# Patient Record
Sex: Male | Born: 1985 | Race: White | Hispanic: No | Smoking: Never smoker
Health system: Southern US, Community
[De-identification: ages and names within clinical notes are randomized; demographics above are authoritative.]

## PROBLEM LIST (undated history)

## (undated) DIAGNOSIS — F419 Anxiety disorder, unspecified: Secondary | ICD-10-CM

## (undated) DIAGNOSIS — M1990 Unspecified osteoarthritis, unspecified site: Secondary | ICD-10-CM

## (undated) HISTORY — PX: LUMBAR DISC SURGERY: SHX700

## (undated) HISTORY — PX: COSMETIC SURGERY: SHX468

---

## 2020-10-26 ENCOUNTER — Emergency Department (HOSPITAL_COMMUNITY): Payer: Worker's Compensation

## 2020-10-26 ENCOUNTER — Inpatient Hospital Stay (HOSPITAL_COMMUNITY)
Admission: EM | Admit: 2020-10-26 | Discharge: 2020-10-30 | DRG: 505 | Disposition: A | Discharge status: Home / Self Care | Payer: Worker's Compensation | Attending: Orthopedic Surgery | Admitting: Orthopedic Surgery

## 2020-10-26 DIAGNOSIS — W230XXA Caught, crushed, jammed, or pinched between moving objects, initial encounter: Secondary | ICD-10-CM | POA: Diagnosis present

## 2020-10-26 DIAGNOSIS — Z20822 Contact with and (suspected) exposure to covid-19: Secondary | ICD-10-CM | POA: Diagnosis present

## 2020-10-26 DIAGNOSIS — S93325A Dislocation of tarsometatarsal joint of left foot, initial encounter: Secondary | ICD-10-CM | POA: Diagnosis not present

## 2020-10-26 DIAGNOSIS — Y990 Civilian activity done for income or pay: Secondary | ICD-10-CM

## 2020-10-26 DIAGNOSIS — S93324D Dislocation of tarsometatarsal joint of right foot, subsequent encounter: Secondary | ICD-10-CM

## 2020-10-26 DIAGNOSIS — Z720 Tobacco use: Secondary | ICD-10-CM

## 2020-10-26 DIAGNOSIS — S9782XA Crushing injury of left foot, initial encounter: Secondary | ICD-10-CM

## 2020-10-26 DIAGNOSIS — S93326A Dislocation of tarsometatarsal joint of unspecified foot, initial encounter: Secondary | ICD-10-CM | POA: Diagnosis present

## 2020-10-26 LAB — CBC WITH DIFFERENTIAL/PLATELET
Abs Immature Granulocytes: 0.06 10*3/uL (ref 0.00–0.07)
Basophils Absolute: 0 10*3/uL (ref 0.0–0.1)
Basophils Relative: 0 %
Eosinophils Absolute: 0 10*3/uL (ref 0.0–0.5)
Eosinophils Relative: 0 %
HCT: 42.2 % (ref 39.0–52.0)
Hemoglobin: 14.5 g/dL (ref 13.0–17.0)
Immature Granulocytes: 0 %
Lymphocytes Relative: 7 %
Lymphs Abs: 1 10*3/uL (ref 0.7–4.0)
MCH: 31.5 pg (ref 26.0–34.0)
MCHC: 34.4 g/dL (ref 30.0–36.0)
MCV: 91.7 fL (ref 80.0–100.0)
Monocytes Absolute: 0.9 10*3/uL (ref 0.1–1.0)
Monocytes Relative: 7 %
Neutro Abs: 11.4 10*3/uL — ABNORMAL HIGH (ref 1.7–7.7)
Neutrophils Relative %: 86 %
Platelets: 281 10*3/uL (ref 150–400)
RBC: 4.6 MIL/uL (ref 4.22–5.81)
RDW: 13.2 % (ref 11.5–15.5)
WBC: 13.4 10*3/uL — ABNORMAL HIGH (ref 4.0–10.5)
nRBC: 0 % (ref 0.0–0.2)

## 2020-10-26 LAB — BASIC METABOLIC PANEL
Anion gap: 7 (ref 5–15)
BUN: 10 mg/dL (ref 6–20)
CO2: 26 mmol/L (ref 22–32)
Calcium: 8.5 mg/dL — ABNORMAL LOW (ref 8.9–10.3)
Chloride: 105 mmol/L (ref 98–111)
Creatinine, Ser: 1 mg/dL (ref 0.61–1.24)
GFR, Estimated: >60 mL/min (ref >60)
Glucose, Bld: 127 mg/dL — ABNORMAL HIGH (ref 70–99)
Potassium: 3.4 mmol/L — ABNORMAL LOW (ref 3.5–5.1)
Sodium: 138 mmol/L (ref 135–145)

## 2020-10-26 LAB — RESP PANEL BY RT-PCR (FLU A&B, COVID) ARPGX2
Influenza A by PCR: NEGATIVE
Influenza B by PCR: NEGATIVE
SARS Coronavirus 2 by RT PCR: NEGATIVE

## 2020-10-26 MED ORDER — HYDROMORPHONE HCL 1 MG/ML IJ SOLN
1.0000 mg | Freq: Once | INTRAMUSCULAR | Status: AC
  Administered 2020-10-26: 1 mg via INTRAVENOUS
  Filled 2020-10-26: qty 1

## 2020-10-26 MED ORDER — HYDROMORPHONE HCL 1 MG/ML IJ SOLN
1.0000 mg | Freq: Once | INTRAMUSCULAR | Status: AC
Start: 2020-10-26 — End: 2020-10-26
  Administered 2020-10-26: 1 mg via INTRAVENOUS
  Filled 2020-10-26: qty 1

## 2020-10-26 NOTE — ED Triage Notes (Signed)
BIB [employer] [facility] from work. Pt was on a rolling machinery and left foot got stuck between bar and rollers. When EMS arrived pt foot was unstuck and he was lying on the ground. No LOC. Can move big toe, but not others. Pulses intact.   fentanyl 110 heart rate 132/82 97% room air  18 r AC  18 L FA

## 2020-10-26 NOTE — ED Provider Notes (Signed)
[facility] EMERGENCY DEPARTMENT Provider Note   CSN: [id number] Arrival date & time: 10/26/20  2044     History Chief Complaint  Patient presents with   Foot Injury    Kent Day is a 35 y.o. male with no reported medical history.  Presents to the emergency department with a chief complaint of left foot injury.  Patient states that at approximately 2000 this evening while at work his foot got caught between a rolling conveyor belt.  Foot was stuck for approximately 45 -60 seconds.  Patient endorses pain to left foot.  States that "it feels like my foot is on fire."  Rates pain 9/10 on the pain scale.  Pain is worse with movement or touch.  Patient has had minimal relief with 200 mcg fentanyl he received in route to emergency department.  Patient denies any numbness.  Endorses weakness stating he cannot move 2nd-5th digit of left foot.  Unsure when his last tetanus shot was.   Foot Injury Associated symptoms: no back pain and no neck pain       No past medical history on file.  There are no problems to display for this patient.        No family history on file.     Home Medications Prior to Admission medications   Not on File    Allergies    Patient has no allergy information on record.  Review of Systems   Review of Systems  HENT:  Negative for facial swelling.   Respiratory:  Negative for shortness of breath.   Cardiovascular:  Negative for chest pain.  Gastrointestinal:  Negative for anal bleeding, nausea and vomiting.  Musculoskeletal:  Positive for arthralgias and myalgias. Negative for back pain and neck pain.  Skin:  Positive for wound. Negative for color change, pallor and rash.  Allergic/Immunologic: Negative for immunocompromised state.  Neurological:  Positive for weakness. Negative for numbness.  Hematological:  Does not bruise/bleed easily.  Psychiatric/Behavioral:  Negative for confusion.    Physical Exam Updated Vital  Signs There were no vitals taken for this visit.  Physical Exam Vitals and nursing note reviewed.  Constitutional:      General: He is not in acute distress.    Appearance: He is not ill-appearing, toxic-appearing or diaphoretic.     Comments: In obvious discomfort secondary to complaints of pain  HENT:     Head: Normocephalic.  Eyes:     General: No scleral icterus.       Right eye: No discharge.        Left eye: No discharge.  Cardiovascular:     Rate and Rhythm: Normal rate.  Pulmonary:     Effort: Pulmonary effort is normal.  Musculoskeletal:     Left lower leg: Normal.     Left ankle: No swelling, deformity, ecchymosis or lacerations. No tenderness. Decreased range of motion (2ndary to complaints of left foot pain). Normal pulse.     Left foot: Decreased range of motion. Normal capillary refill. Swelling, deformity, tenderness and bony tenderness present. No crepitus. Normal pulse.     Comments: Obvious deformity with skin tenting to medial aspect of left midfoot.  Patient has full range of motion of left great toe.  Patient unable to move 2nd-5th digits.  Cap refill less than 2 seconds in great toe.  Cap refill 4 seconds in 2nd-5th digits.  +2 DP pulse diffuse tenderness throughout midfoot.  Superficial abrasions noted.  Patient endorses decreased sensation to all  digits of left foot.    Skin:    General: Skin is warm and dry.  Neurological:     General: No focal deficit present.     Mental Status: He is alert.  Psychiatric:        Behavior: Behavior is cooperative.      ED Results / Procedures / Treatments   Labs (all labs ordered are listed, but only abnormal results are displayed) Labs Reviewed  BASIC METABOLIC PANEL - Abnormal; Notable for the following components:      Result Value   Potassium 3.4 (*)    Glucose, Bld 127 (*)    Calcium 8.5 (*)    All other components within normal limits  CBC WITH DIFFERENTIAL/PLATELET - Abnormal; Notable for the following  components:   WBC 13.4 (*)    Neutro Abs 11.4 (*)    All other components within normal limits  RESP PANEL BY RT-PCR (FLU A&B, COVID) [id number]    EKG None  Radiology DG Ankle Complete Left  Result Date: 10/26/2020 CLINICAL DATA:  Left foot injury at work. EXAM: LEFT ANKLE COMPLETE - 3+ VIEW COMPARISON:  None. FINDINGS: There is no evidence of fracture, dislocation, or joint effusion involving the left ankle. There is no evidence of arthropathy or other focal bone abnormality. Soft tissues are unremarkable. IMPRESSION: No abnormality seen involving the left ankle. Fractures and dislocations are seen involving the tarsometatarsal joints and fourth metatarsal as described on foot radiographs of same day. Electronically Signed   By: Lupita Raider M.D.   On: 10/26/2020 21:41   DG Foot Complete Left  Result Date: 10/26/2020 CLINICAL DATA:  Left foot pain after injury at work. EXAM: LEFT FOOT - COMPLETE 3+ VIEW COMPARISON:  None. FINDINGS: Mildly displaced fracture is seen involving the distal fourth metatarsal. Lisfranc dislocations are seen involving the tarsometatarsal joints. IMPRESSION: Lisfranc dislocations are seen involving all 5 tarsometatarsal joints. Mildly displaced distal fourth metatarsal fracture is noted. Electronically Signed   By: Lupita Raider M.D.   On: 10/26/2020 21:40    Procedures Procedures   Medications Ordered in ED Medications  HYDROmorphone (DILAUDID) injection 1 mg (1 mg Intravenous Given 10/26/20 2132)  HYDROmorphone (DILAUDID) injection 1 mg (1 mg Intravenous Given 10/26/20 2306)    ED Course  I have reviewed the triage vital signs and the nursing notes.  Pertinent labs & imaging results that were available during my care of the patient were reviewed by me and considered in my medical decision making (see chart for details).    MDM Rules/Calculators/A&P                           Alert 35 year old male no acute distress, nontoxic appearing.  Presents to  the emergency department with left foot injury.  Left foot was caught between a piece of rolling machinery while at work.  Injury occurred approximately 2000.    +2 DP pulse.  Patient has full range of motion of left great toe.  Patient unable to move 2nd-5th digits.  Cap refill less than 2 seconds in great toe.  Cap refill 4 seconds in 2nd-5th digits.    Will obtain x-ray imaging of left foot and ankle.  Will order basic lab work and COVID-19 swab.  Patient given Dilaudid for pain management.  X-ray imaging shows Lisfranc dislocations involving all 5 tarsometatarsal joints.  Mildly displaced distal fourth metatarsal fracture.  Will consult on-call orthopedic provider. 2200 spoke  to Dr. Roda Shutters who advised he will see the patient for admission.  Patient will need to be n.p.o. after midnight.  On serial reexamination patient continues to have +2 DP pulse.  Final Clinical Impression(s) / ED Diagnoses Final diagnoses:  Lisfranc dislocation, left, initial encounter    Rx / DC Orders ED Discharge Orders     None        Haskel Schroeder, PA-C 10/26/20 2355    Rozelle Logan, DO 10/27/20 2348

## 2020-10-26 NOTE — Progress Notes (Signed)
Orthopedic Tech Progress Note Patient Details:  Kent Day 08-09-1985 [id number] Added an ABD PAD to foot before applying the [employer] DRESSING to patient   Ortho Devices Type of Ortho Device: Lenora Boys splint Ortho Device/Splint Location: LLE Ortho Device/Splint Interventions: Ordered, Application, Adjustment   Post Interventions Patient Tolerated: Well Instructions Provided: Care of device  [name] 10/26/2020, 10:33 PM

## 2020-10-27 ENCOUNTER — Inpatient Hospital Stay (HOSPITAL_COMMUNITY): Payer: Worker's Compensation | Admitting: Anesthesiology

## 2020-10-27 ENCOUNTER — Inpatient Hospital Stay (HOSPITAL_COMMUNITY): Payer: Worker's Compensation

## 2020-10-27 ENCOUNTER — Encounter (HOSPITAL_COMMUNITY): Payer: Self-pay | Admitting: Orthopaedic Surgery

## 2020-10-27 ENCOUNTER — Encounter (HOSPITAL_COMMUNITY)
Admission: EM | Disposition: A | Discharge status: Home / Self Care | Payer: Self-pay | Source: Home / Self Care | Attending: Orthopedic Surgery

## 2020-10-27 DIAGNOSIS — S93325A Dislocation of tarsometatarsal joint of left foot, initial encounter: Secondary | ICD-10-CM | POA: Diagnosis not present

## 2020-10-27 DIAGNOSIS — S9782XA Crushing injury of left foot, initial encounter: Secondary | ICD-10-CM | POA: Diagnosis not present

## 2020-10-27 HISTORY — PX: OPEN REDUCTION INTERNAL FIXATION (ORIF) METACARPAL: SHX6234

## 2020-10-27 HISTORY — PX: APPLICATION OF WOUND VAC: SHX5189

## 2020-10-27 LAB — SURGICAL PCR SCREEN
MRSA, PCR: NEGATIVE
Staphylococcus aureus: POSITIVE — AB

## 2020-10-27 SURGERY — OPEN REDUCTION INTERNAL FIXATION (ORIF) METACARPAL
Anesthesia: Regional | Site: Foot | Laterality: Left

## 2020-10-27 MED ORDER — CHLORHEXIDINE GLUCONATE CLOTH 2 % EX PADS: 6.0000 | MEDICATED_PAD | Freq: Every day | CUTANEOUS | Status: DC

## 2020-10-27 MED ORDER — FENTANYL CITRATE (PF) 100 MCG/2ML IJ SOLN: 50.0000 ug | Freq: Once | INTRAMUSCULAR | Status: AC

## 2020-10-27 MED ORDER — LIDOCAINE 2% (20 MG/ML) 5 ML SYRINGE
INTRAMUSCULAR | Status: DC | PRN
  Administered 2020-10-27: 80 mg via INTRAVENOUS

## 2020-10-27 MED ORDER — EPHEDRINE 5 MG/ML INJ
INTRAVENOUS | Status: AC
  Filled 2020-10-27: qty 5

## 2020-10-27 MED ORDER — OXYCODONE HCL 5 MG PO TABS
5.0000 mg | ORAL_TABLET | ORAL | Status: DC | PRN
  Administered 2020-10-27: 5 mg via ORAL
  Administered 2020-10-28 – 2020-10-30 (×8): 10 mg via ORAL
  Filled 2020-10-27: qty 1
  Filled 2020-10-27 (×8): qty 2

## 2020-10-27 MED ORDER — FENTANYL CITRATE (PF) 100 MCG/2ML IJ SOLN: 25.0000 ug | INTRAMUSCULAR | Status: DC | PRN

## 2020-10-27 MED ORDER — PROPOFOL 10 MG/ML IV BOLUS
INTRAVENOUS | Status: AC
  Filled 2020-10-27: qty 20

## 2020-10-27 MED ORDER — ETOMIDATE 2 MG/ML IV SOLN
INTRAVENOUS | Status: AC
  Filled 2020-10-27: qty 10

## 2020-10-27 MED ORDER — HYDROMORPHONE HCL 1 MG/ML IJ SOLN
0.5000 mg | Freq: Once | INTRAMUSCULAR | Status: AC
  Administered 2020-10-27: 0.5 mg via INTRAVENOUS
  Filled 2020-10-27: qty 1

## 2020-10-27 MED ORDER — METOCLOPRAMIDE HCL 5 MG PO TABS: 5.0000 mg | ORAL_TABLET | Freq: Three times a day (TID) | ORAL | Status: DC | PRN

## 2020-10-27 MED ORDER — ONDANSETRON HCL 4 MG/2ML IJ SOLN: 4.0000 mg | Freq: Four times a day (QID) | INTRAMUSCULAR | Status: DC | PRN

## 2020-10-27 MED ORDER — PROPOFOL 10 MG/ML IV BOLUS
INTRAVENOUS | Status: DC | PRN
Start: 2020-10-27 — End: 2020-10-27
  Administered 2020-10-27: 200 mg via INTRAVENOUS

## 2020-10-27 MED ORDER — LACTATED RINGERS IV SOLN: INTRAVENOUS | Status: DC | PRN

## 2020-10-27 MED ORDER — ORAL CARE MOUTH RINSE: 15.0000 mL | Freq: Once | OROMUCOSAL | Status: AC

## 2020-10-27 MED ORDER — ONDANSETRON HCL 4 MG/2ML IJ SOLN
INTRAMUSCULAR | Status: AC
  Filled 2020-10-27: qty 2

## 2020-10-27 MED ORDER — ONDANSETRON HCL 4 MG/2ML IJ SOLN
INTRAMUSCULAR | Status: AC
  Filled 2020-10-27: qty 4

## 2020-10-27 MED ORDER — FENTANYL CITRATE (PF) 250 MCG/5ML IJ SOLN
INTRAMUSCULAR | Status: AC
  Filled 2020-10-27: qty 5

## 2020-10-27 MED ORDER — 0.9 % SODIUM CHLORIDE (POUR BTL) OPTIME
TOPICAL | Status: DC | PRN
  Administered 2020-10-27: 1000 mL

## 2020-10-27 MED ORDER — CEFAZOLIN SODIUM-DEXTROSE 1-4 GM/50ML-% IV SOLN
1.0000 g | Freq: Four times a day (QID) | INTRAVENOUS | Status: AC
  Administered 2020-10-27 – 2020-10-28 (×3): 1 g via INTRAVENOUS
  Filled 2020-10-27 (×3): qty 50

## 2020-10-27 MED ORDER — ONDANSETRON HCL 4 MG PO TABS: 4.0000 mg | ORAL_TABLET | Freq: Four times a day (QID) | ORAL | Status: DC | PRN

## 2020-10-27 MED ORDER — HYDROMORPHONE HCL 1 MG/ML IJ SOLN
0.5000 mg | INTRAMUSCULAR | Status: DC | PRN
  Administered 2020-10-27 – 2020-10-28 (×3): 1 mg via INTRAVENOUS
  Filled 2020-10-27 (×3): qty 1

## 2020-10-27 MED ORDER — SODIUM CHLORIDE 0.9 % IV SOLN: INTRAVENOUS | Status: DC

## 2020-10-27 MED ORDER — ACETAMINOPHEN 325 MG PO TABS
325.0000 mg | ORAL_TABLET | Freq: Four times a day (QID) | ORAL | Status: DC | PRN
  Administered 2020-10-30: 650 mg via ORAL
  Filled 2020-10-27: qty 2

## 2020-10-27 MED ORDER — LACTATED RINGERS IV SOLN: INTRAVENOUS | Status: DC

## 2020-10-27 MED ORDER — HYDROMORPHONE HCL 1 MG/ML IJ SOLN
1.0000 mg | INTRAMUSCULAR | Status: DC | PRN
Start: 2020-10-27 — End: 2020-10-27
  Administered 2020-10-27 (×4): 1 mg via INTRAVENOUS
  Filled 2020-10-27 (×4): qty 1

## 2020-10-27 MED ORDER — DOCUSATE SODIUM 100 MG PO CAPS
100.0000 mg | ORAL_CAPSULE | Freq: Two times a day (BID) | ORAL | Status: DC
  Administered 2020-10-27 – 2020-10-30 (×6): 100 mg via ORAL
  Filled 2020-10-27 (×6): qty 1

## 2020-10-27 MED ORDER — ONDANSETRON HCL 4 MG/2ML IJ SOLN
INTRAMUSCULAR | Status: DC | PRN
Start: 2020-10-27 — End: 2020-10-27
  Administered 2020-10-27: 4 mg via INTRAVENOUS

## 2020-10-27 MED ORDER — FENTANYL CITRATE (PF) 100 MCG/2ML IJ SOLN
INTRAMUSCULAR | Status: AC
  Administered 2020-10-27: 50 ug via INTRAVENOUS
  Filled 2020-10-27: qty 2

## 2020-10-27 MED ORDER — ROPIVACAINE HCL 5 MG/ML IJ SOLN
INTRAMUSCULAR | Status: DC | PRN
  Administered 2020-10-27: 30 mL via PERINEURAL

## 2020-10-27 MED ORDER — LACTATED RINGERS IV SOLN: INTRAVENOUS | Status: AC

## 2020-10-27 MED ORDER — METHOCARBAMOL 1000 MG/10ML IJ SOLN
500.0000 mg | Freq: Four times a day (QID) | INTRAVENOUS | Status: DC | PRN
  Filled 2020-10-27: qty 5

## 2020-10-27 MED ORDER — BISACODYL 10 MG RE SUPP: 10.0000 mg | Freq: Every day | RECTAL | Status: DC | PRN

## 2020-10-27 MED ORDER — PROMETHAZINE HCL 25 MG/ML IJ SOLN: 6.2500 mg | INTRAMUSCULAR | Status: DC | PRN

## 2020-10-27 MED ORDER — MIDAZOLAM HCL 2 MG/2ML IJ SOLN: 2.0000 mg | Freq: Once | INTRAMUSCULAR | Status: AC

## 2020-10-27 MED ORDER — LIDOCAINE 2% (20 MG/ML) 5 ML SYRINGE
INTRAMUSCULAR | Status: AC
  Filled 2020-10-27: qty 15

## 2020-10-27 MED ORDER — OXYCODONE HCL 5 MG PO TABS
5.0000 mg | ORAL_TABLET | Freq: Once | ORAL | Status: DC | PRN
Start: 2020-10-27 — End: 2020-10-27

## 2020-10-27 MED ORDER — OXYCODONE HCL 5 MG/5ML PO SOLN: 5.0000 mg | Freq: Once | ORAL | Status: DC | PRN

## 2020-10-27 MED ORDER — OXYCODONE HCL 5 MG PO TABS
10.0000 mg | ORAL_TABLET | ORAL | Status: DC | PRN
  Administered 2020-10-28 – 2020-10-29 (×2): 10 mg via ORAL
  Filled 2020-10-27 (×2): qty 2

## 2020-10-27 MED ORDER — POLYETHYLENE GLYCOL 3350 17 G PO PACK
17.0000 g | PACK | Freq: Every day | ORAL | Status: DC | PRN
  Filled 2020-10-27: qty 1

## 2020-10-27 MED ORDER — CHLORHEXIDINE GLUCONATE 0.12 % MT SOLN
15.0000 mL | Freq: Once | OROMUCOSAL | Status: AC
  Administered 2020-10-27: 15 mL via OROMUCOSAL

## 2020-10-27 MED ORDER — MUPIROCIN 2 % EX OINT
1.0000 | TOPICAL_OINTMENT | Freq: Two times a day (BID) | CUTANEOUS | Status: DC
Start: 2020-10-27 — End: 2020-10-30
  Administered 2020-10-27 – 2020-10-30 (×6): 1 via NASAL
  Filled 2020-10-27: qty 22

## 2020-10-27 MED ORDER — MIDAZOLAM HCL 2 MG/2ML IJ SOLN
INTRAMUSCULAR | Status: AC
  Administered 2020-10-27: 2 mg via INTRAVENOUS
  Filled 2020-10-27: qty 2

## 2020-10-27 MED ORDER — EPHEDRINE 5 MG/ML INJ
INTRAVENOUS | Status: AC
  Filled 2020-10-27: qty 10

## 2020-10-27 MED ORDER — METHOCARBAMOL 500 MG PO TABS
500.0000 mg | ORAL_TABLET | Freq: Four times a day (QID) | ORAL | Status: DC | PRN
  Administered 2020-10-28 – 2020-10-29 (×3): 500 mg via ORAL
  Filled 2020-10-27 (×3): qty 1

## 2020-10-27 MED ORDER — METOCLOPRAMIDE HCL 5 MG/ML IJ SOLN: 5.0000 mg | Freq: Three times a day (TID) | INTRAMUSCULAR | Status: DC | PRN

## 2020-10-27 SURGICAL SUPPLY — 57 items
BAG COUNTER SPONGE SURGICOUNT (BAG) ×2 IMPLANT
BIT DRILL 3.0 6IN (DRILL) ×1 IMPLANT
BLADE AVERAGE 25X9 (BLADE) ×2 IMPLANT
BLADE MINI RND TIP GREEN BEAV (BLADE) IMPLANT
BNDG COHESIVE 1X5 TAN STRL LF (GAUZE/BANDAGES/DRESSINGS) IMPLANT
BNDG COHESIVE 6X5 TAN STRL LF (GAUZE/BANDAGES/DRESSINGS) IMPLANT
BNDG ESMARK 4X9 LF (GAUZE/BANDAGES/DRESSINGS) ×2 IMPLANT
BNDG GAUZE ELAST 4 BULKY (GAUZE/BANDAGES/DRESSINGS) IMPLANT
CORD BIPOLAR FORCEPS 12FT (ELECTRODE) ×2 IMPLANT
COTTON STERILE ROLL (GAUZE/BANDAGES/DRESSINGS) IMPLANT
COVER SURGICAL LIGHT HANDLE (MISCELLANEOUS) ×4 IMPLANT
CUFF TOURN SGL QUICK 18X4 (TOURNIQUET CUFF) IMPLANT
CUFF TOURN SGL QUICK 24 (TOURNIQUET CUFF)
CUFF TOURN SGL QUICK 34 (TOURNIQUET CUFF)
CUFF TOURN SGL QUICK 42 (TOURNIQUET CUFF) IMPLANT
CUFF TRNQT CYL 24X4X16.5-23 (TOURNIQUET CUFF) IMPLANT
CUFF TRNQT CYL 34X4.125X (TOURNIQUET CUFF) IMPLANT
DRAPE DERMATAC (DRAPES) ×6 IMPLANT
DRAPE OEC MINIVIEW 54X84 (DRAPES) ×2 IMPLANT
DRAPE U-SHAPE 47X51 STRL (DRAPES) ×2 IMPLANT
DRILL 3.0 6IN (DRILL) ×2
DRSG ADAPTIC 3X8 NADH LF (GAUZE/BANDAGES/DRESSINGS) IMPLANT
DURAPREP 26ML APPLICATOR (WOUND CARE) ×2 IMPLANT
ELECT REM PT RETURN 9FT ADLT (ELECTROSURGICAL) ×2
ELECTRODE REM PT RTRN 9FT ADLT (ELECTROSURGICAL) ×1 IMPLANT
GAUZE SPONGE 2X2 8PLY STRL LF (GAUZE/BANDAGES/DRESSINGS) IMPLANT
GAUZE SPONGE 4X4 12PLY STRL (GAUZE/BANDAGES/DRESSINGS) IMPLANT
GLOVE SURG ORTHO LTX SZ9 (GLOVE) ×2 IMPLANT
GLOVE SURG UNDER POLY LF SZ9 (GLOVE) ×2 IMPLANT
GOWN STRL REUS W/ TWL XL LVL3 (GOWN DISPOSABLE) ×2 IMPLANT
GOWN STRL REUS W/TWL XL LVL3 (GOWN DISPOSABLE) ×2
GUIDEWIRE 1.6 6IN (WIRE) ×4 IMPLANT
KIT BASIN OR (CUSTOM PROCEDURE TRAY) ×2 IMPLANT
KIT STAPLE ARCUS 20X17 STRL (Staple) ×2 IMPLANT
KIT TURNOVER KIT B (KITS) ×2 IMPLANT
MANIFOLD NEPTUNE II (INSTRUMENTS) ×2 IMPLANT
NEEDLE HYPO 25GX1X1/2 BEV (NEEDLE) IMPLANT
NS IRRIG 1000ML POUR BTL (IV SOLUTION) ×2 IMPLANT
PACK ORTHO EXTREMITY (CUSTOM PROCEDURE TRAY) ×2 IMPLANT
PAD ARMBOARD 7.5X6 YLW CONV (MISCELLANEOUS) ×4 IMPLANT
PAD CAST 4YDX4 CTTN HI CHSV (CAST SUPPLIES) IMPLANT
PADDING CAST COTTON 4X4 STRL (CAST SUPPLIES)
PREVENA RESTOR AXIOFORM 29X28 (GAUZE/BANDAGES/DRESSINGS) ×2 IMPLANT
SCREW CANN SHT HDLS 4X40 (Screw) ×4 IMPLANT
SPECIMEN JAR SMALL (MISCELLANEOUS) IMPLANT
SPONGE GAUZE 2X2 STER 10/PKG (GAUZE/BANDAGES/DRESSINGS)
STAPLE ASSEMBLY ARCUS 20X17 (Staple) ×2 IMPLANT
SUCTION FRAZIER HANDLE 10FR (MISCELLANEOUS) ×1
SUCTION TUBE FRAZIER 10FR DISP (MISCELLANEOUS) ×1 IMPLANT
SUT ETHILON 2 0 PSLX (SUTURE) ×4 IMPLANT
SUT VIC AB 2-0 CT1 27 (SUTURE) ×1
SUT VIC AB 2-0 CT1 TAPERPNT 27 (SUTURE) ×1 IMPLANT
SYR CONTROL 10ML LL (SYRINGE) IMPLANT
TOWEL GREEN STERILE (TOWEL DISPOSABLE) ×2 IMPLANT
TOWEL GREEN STERILE FF (TOWEL DISPOSABLE) ×2 IMPLANT
TUBE CONNECTING 12X1/4 (SUCTIONS) IMPLANT
WATER STERILE IRR 1000ML POUR (IV SOLUTION) IMPLANT

## 2020-10-27 NOTE — Anesthesia Preprocedure Evaluation (Addendum)
Anesthesia Evaluation  Patient identified by MRN, date of birth, ID band Patient awake    Reviewed: Allergy & Precautions, NPO status , Patient's Chart, lab work & pertinent test results  Airway Mallampati: II  TM Distance: >3 FB Neck ROM: Full    Dental no notable dental hx.    Pulmonary neg pulmonary ROS, Patient abstained from smoking.,    Pulmonary exam normal breath sounds clear to auscultation       Cardiovascular negative cardio ROS Normal cardiovascular exam Rhythm:Regular Rate:Normal     Neuro/Psych negative neurological ROS  negative psych ROS   GI/Hepatic negative GI ROS, Neg liver ROS,   Endo/Other  negative endocrine ROS  Renal/GU negative Renal ROS  negative genitourinary   Musculoskeletal negative musculoskeletal ROS (+)   Abdominal   Peds negative pediatric ROS (+)  Hematology negative hematology ROS (+)   Anesthesia Other Findings   Reproductive/Obstetrics negative OB ROS                            Anesthesia Physical Anesthesia Plan  ASA: 2  Anesthesia Plan: General and Regional   Post-op Pain Management:  Regional for Post-op pain   Induction: Intravenous  PONV Risk Score and Plan: 2  Airway Management Planned: LMA  Additional Equipment: None  Intra-op Plan:   Post-operative Plan: Extubation in OR  Informed Consent: I have reviewed the patients History and Physical, chart, labs and discussed the procedure including the risks, benefits and alternatives for the proposed anesthesia with the patient or authorized representative who has indicated his/her understanding and acceptance.     Dental advisory given  Plan Discussed with: CRNA, Anesthesiologist and Surgeon  Anesthesia Plan Comments:         Anesthesia Quick Evaluation

## 2020-10-27 NOTE — Op Note (Signed)
10/27/2020  1:47 PM  PATIENT:  Kent Day    PRE-OPERATIVE DIAGNOSIS:  Left Foot Fx  POST-OPERATIVE DIAGNOSIS:  Same  PROCEDURE:  OPEN REDUCTION INTERNAL FIXATION (ORIF) FOOT, APPLICATION OF WOUND VAC  SURGEON:  Nadara Mustard, MD  PHYSICIAN ASSISTANT:None ANESTHESIA:   General  PREOPERATIVE INDICATIONS:  Kent Day is a  35 y.o. male with a diagnosis of Left Foot Fx who failed conservative measures and elected for surgical management.    The risks benefits and alternatives were discussed with the patient preoperatively including but not limited to the risks of infection, bleeding, nerve injury, cardiopulmonary complications, the need for revision surgery, among others, and the patient was willing to proceed.  OPERATIVE IMPLANTS: 4.0 headless cannulated compression screws x2 and staples x2.  @ENCIMAGES @  OPERATIVE FINDINGS: C-arm fluoroscopy verified reduction in both AP and lateral planes of the Lisfranc dislocation.  OPERATIVE PROCEDURE: Patient was brought the operating room after undergoing a regional block he then underwent a general anesthetic.  After adequate levels anesthesia were obtained patient's left lower extremity was prepped using DuraPrep draped into a sterile field a timeout was called.  Examination patient had progressive ischemic changes to the soft tissue envelope over the dorsum of the foot.  Approximately 25% of the dorsal skin was ischemic.  The dorsal incision in the first webspace was made obliquely to avoid the areas of most ischemic skin.  Blunt dissection was carried down to the interval between the EHL and the anterior tibial tendon.  This interval was created subperiosteal dissection was created over the base of the first metatarsal and medial cuneiform.  This joint would not reduce due to the dislocation of the second metatarsal beneath the middle cuneiform.  Further dissection was performed around the second metatarsal and this was brought up and  reduced this allowed the first metatarsal to reduce well.  A guidewire was then inserted to stabilize the base of the first metatarsal and medial cuneiform and with compression across the first and second metatarsals a guidewire was placed from the medial cuneiform into the base of the second metatarsal.  See arthroscopy verified alignment in AP and lateral planes.  A 40 mm headless compression screw was then placed from the base of the first metatarsal into the medial cuneiform to stabilize the medial column an oblique screw was then placed 40 mm headless compression screw across the Lisfranc ligament to stabilize the second metatarsal.  2 staples were then placed to further stabilize the base of the first metatarsal medial cuneiform and the base of the second metatarsal with the middle cuneiform.  See arthroscopy verified alignment.  The wound was irrigated with normal saline the incision was closed using 2-0 nylon care was taken to ensure that the sutures were through the healthy tissue and not through the ischemic nonviable tissue more medially.  This was secondary to the metal roller crush injury.  The wound closed well without tension on the skin.  The axial form Prevena wound VAC was then applied   DISCHARGE PLANNING:  Antibiotic duration: Other protect the soft tissue.  This was outlined with derma tack this had a good suction fit.  The compression wrap was then overwrapped with Coban.  Patient was extubated taken the PACU in stable condition.  Weightbearing: Nonweightbearing on the left  Pain medication: Opioid pathway  Dressing care/ Wound VAC: Continue wound VAC at discharge  Ambulatory devices: Crutches  Discharge to: Anticipate discharge to home.  Follow-up: In the office 1 week  post operative.

## 2020-10-27 NOTE — Progress Notes (Signed)
Orthopedic Tech Progress Note Patient Details:  Kent Day 08-Aug-1985 [id number] CAM walker was sized using patient's right foot. Patient also had his own pair of crutches.  Ortho Devices Type of Ortho Device: CAM walker Ortho Device/Splint Location: Left Foot Ortho Device/Splint Interventions: Ordered   Post Interventions Patient Tolerated: Well Instructions Provided: Adjustment of device  Naomia Lenderman E Kent Day 10/27/2020, 3:04 PM

## 2020-10-27 NOTE — ED Notes (Signed)
This RN spoke to pts mother on phone with pts request. Updated her on plan of care. She would like to be contacted when pt is out of surgery. Her number is in the chart

## 2020-10-27 NOTE — ED Notes (Signed)
Pt is requesting something else for pain. Pt reports that Dilaudid helps for about 5 minutes and then the pain comes back. MD notified

## 2020-10-27 NOTE — Consult Note (Signed)
ORTHOPAEDIC CONSULTATION  REQUESTING PHYSICIAN: Kent Kos, MD  Chief Complaint: Painful crush injury to the left foot.  HPI: Kent Day is a 35 y.o. male who presents with Lisfranc fracture dislocation crush injury to the right foot.  Patient states that he got his foot caught between a metal roller and the metal structure and his foot was crushed for about 60 seconds.  No past medical history on file.  Social History   Socioeconomic History   Marital status: Unknown    Spouse name: Not on file   Number of children: Not on file   Years of education: Not on file   Highest education level: Not on file  Occupational History   Not on file  Tobacco Use   Smoking status: Not on file   Smokeless tobacco: Not on file  Substance and Sexual Activity   Alcohol use: Not on file   Drug use: Not on file   Sexual activity: Not on file  Other Topics Concern   Not on file  Social History Narrative   Not on file   Social Determinants of Health   Financial Resource Strain: Not on file  Food Insecurity: Not on file  Transportation Needs: Not on file  Physical Activity: Not on file  Stress: Not on file  Social Connections: Not on file   No family history on file. - negative except otherwise stated in the family history section No Known Allergies Prior to Admission medications   Medication Sig Start Date End Date Taking? Authorizing Provider  ibuprofen (ADVIL) 200 MG tablet Take 200 mg by mouth every 6 (six) hours as needed for mild pain.   Yes [provider]   DG Ankle Complete Left  Result Date: 10/26/2020 CLINICAL DATA:  Left foot injury at work. EXAM: LEFT ANKLE COMPLETE - 3+ VIEW COMPARISON:  None. FINDINGS: There is no evidence of fracture, dislocation, or joint effusion involving the left ankle. There is no evidence of arthropathy or other focal bone abnormality. Soft tissues are unremarkable. IMPRESSION: No abnormality seen involving the left ankle.  Fractures and dislocations are seen involving the tarsometatarsal joints and fourth metatarsal as described on foot radiographs of same day. Electronically Signed   By: Lupita Raider M.D.   On: 10/26/2020 21:41   CT FOOT LEFT WO CONTRAST  Result Date: 10/27/2020 CLINICAL DATA:  Status post trauma. EXAM: CT OF THE LEFT FOOT WITHOUT CONTRAST TECHNIQUE: Multidetector CT imaging of the left foot was performed according to the standard protocol. Multiplanar CT image reconstructions were also generated. COMPARISON:  None. FINDINGS: Bones/Joint/Cartilage Acute fracture deformities are seen involving the distal aspect of the fourth left metatarsal, base of the second left metatarsal and plantar aspect of the left medial cuneiform bone. Plantar dislocation of the first, third, fourth and fifth left metatarsals is seen. While the second left metatarsal is not dislocated, plantar displacement of the distal fracture site is noted. Ligaments Suboptimally assessed by CT. Muscles and Tendons Limited in evaluation. Soft tissues Marked severity diffuse soft tissue swelling is seen throughout the left foot with an extensive amount of soft tissue edema and inflammatory fat stranding. IMPRESSION: Lisfranc fracture/dislocation of the left foot, as described above, with an additional fracture of the fourth left metatarsal. MRI correlation is recommended. Electronically Signed   By: Aram Candela M.D.   On: 10/27/2020 00:16   DG Foot Complete Left  Result Date: 10/26/2020 CLINICAL DATA:  Left foot pain after injury at work. EXAM:  LEFT FOOT - COMPLETE 3+ VIEW COMPARISON:  None. FINDINGS: Mildly displaced fracture is seen involving the distal fourth metatarsal. Lisfranc dislocations are seen involving the tarsometatarsal joints. IMPRESSION: Lisfranc dislocations are seen involving all 5 tarsometatarsal joints. Mildly displaced distal fourth metatarsal fracture is noted. Electronically Signed   By: Lupita Raider M.D.   On:  10/26/2020 21:40   - pertinent xrays, CT, MRI studies were reviewed and independently interpreted  Positive ROS: All other systems have been reviewed and were otherwise negative with the exception of those mentioned in the HPI and as above.  Physical Exam: General: Alert, no acute distress Psychiatric: Patient is competent for consent with normal mood and affect Lymphatic: No axillary or cervical lymphadenopathy Cardiovascular: No pedal edema Respiratory: No cyanosis, no use of accessory musculature GI: No organomegaly, abdomen is soft and non-tender    Images:  @ENCIMAGES @  Labs:  No results found for: HGBA1C, ESRSEDRATE, CRP, LABURIC, REPTSTATUS, GRAMSTAIN, CULT, LABORGA  No results found for: ALBUMIN, PREALBUMIN, LABURIC   CBC EXTENDED Latest Ref Rng & Units 10/26/2020  WBC 4.0 - 10.5 K/uL 13.4(H)  RBC 4.22 - 5.81 MIL/uL 4.60  HGB 13.0 - 17.0 g/dL 10/28/2020  HCT 60.1 - 09.3 % 42.2  PLT 150 - 400 K/uL 281  NEUTROABS 1.7 - 7.7 K/uL 11.4(H)  LYMPHSABS 0.7 - 4.0 K/uL 1.0    Neurologic: Patient does not have protective sensation bilateral lower extremities.   MUSCULOSKELETAL:   Skin: Examination the dorsum of the skin has areas of black ischemia and white blanching without capillary refill.  The entire dorsal soft tissue envelope to the foot is ischemic.  Patient does have a palpable dorsalis pedis pulse.  Review of the radiographs shows a fracture dislocation across the entire Lisfranc complex with a significantly displaced base of the first metatarsal.  Assessment: Assessment: Fracture dislocation Lisfranc crush injury to the left foot.  With significant soft tissue ischemic changes.  Plan: We will plan for open reduction internal fixation of the left foot.  Discussed with the patient with the significant soft tissue injury patient is at risk of progressive skin ischemic changes risk of permanent neurologic injury and at risk for possible amputation of his foot.  We will  plan for urgent surgery this morning.  Patient states he understands wished to proceed at this time.  Thank you for the consult and the opportunity to see [name], MD [facility] [phone] 8:15 AM

## 2020-10-27 NOTE — Transfer of Care (Signed)
Immediate Anesthesia Transfer of Care Note  Patient: Kent Day  Procedure(s) Performed: OPEN REDUCTION INTERNAL FIXATION (ORIF) FOOT (Left: Foot) APPLICATION OF WOUND VAC (Left: Foot)  Patient Location: PACU  Anesthesia Type:GA combined with regional for post-op pain  Level of Consciousness: drowsy and responds to stimulation  Airway & Oxygen Therapy: Patient Spontanous Breathing and Patient connected to face mask oxygen  Post-op Assessment: Report given to RN and Post -op Vital signs reviewed and stable  Post vital signs: Reviewed and stable  Last Vitals:  Vitals Value Taken Time  BP 108/68 10/27/20 1351  Temp    Pulse 58 10/27/20 1352  Resp 11 10/27/20 1352  SpO2 100 % 10/27/20 1352  Vitals shown include unvalidated device data.  Last Pain:  Vitals:   10/27/20 1144  TempSrc:   PainSc: 10-Worst pain ever      Patients Stated Pain Goal: 3 (10/27/20 1144)  Complications: No notable events documented.

## 2020-10-27 NOTE — Anesthesia Procedure Notes (Signed)
Procedure Name: LMA Insertion Date/Time: 10/27/2020 12:41 PM Performed by: Carlos American, CRNA Pre-anesthesia Checklist: Patient identified, Emergency Drugs available, Suction available and Patient being monitored Patient Re-evaluated:Patient Re-evaluated prior to induction Oxygen Delivery Method: Circle System Utilized Preoxygenation: Pre-oxygenation with 100% oxygen Induction Type: IV induction Ventilation: Mask ventilation without difficulty LMA: LMA inserted LMA Size: 5.0 Number of attempts: 1 Placement Confirmation: positive ETCO2 Tube secured with: Tape Dental Injury: Teeth and Oropharynx as per pre-operative assessment

## 2020-10-27 NOTE — ED Notes (Addendum)
Dr. Lajoyce Corners at bedside, able to move great toes, L pedal pulses 2+, dressing removed by MD, extremity elevated

## 2020-10-27 NOTE — Anesthesia Procedure Notes (Signed)
Anesthesia Regional Block: Popliteal block   Pre-Anesthetic Checklist: , timeout performed,  Correct Patient, Correct Site, Correct Laterality,  Correct Procedure, Correct Position, site marked,  Risks and benefits discussed,  Surgical consent,  Pre-op evaluation,  At surgeon's request and post-op pain management  Laterality: Left  Prep: chloraprep       Needles:  Injection technique: Single-shot  Needle Type: Echogenic Stimulator Needle     Needle Length: 10cm  Needle Gauge: 20     Additional Needles:   Narrative:  Start time: 10/27/2020 12:05 PM End time: 10/27/2020 12:10 PM Injection made incrementally with aspirations every 5 mL.  Performed by: Personally  Anesthesiologist: Mellody Dance, MD  Additional Notes: A functioning IV was confirmed and monitors were applied.  Sterile prep and drape, hand hygiene and sterile gloves were used.  Negative aspiration and test dose prior to incremental administration of local anesthetic. The patient tolerated the procedure well.Ultrasound  guidance: relevant anatomy identified, needle position confirmed, local anesthetic spread visualized around nerve(s), vascular puncture avoided.  Image printed for medical record.

## 2020-10-28 DIAGNOSIS — Y990 Civilian activity done for income or pay: Secondary | ICD-10-CM | POA: Diagnosis not present

## 2020-10-28 DIAGNOSIS — W230XXA Caught, crushed, jammed, or pinched between moving objects, initial encounter: Secondary | ICD-10-CM | POA: Diagnosis present

## 2020-10-28 DIAGNOSIS — S9782XA Crushing injury of left foot, initial encounter: Secondary | ICD-10-CM | POA: Diagnosis present

## 2020-10-28 DIAGNOSIS — Z20822 Contact with and (suspected) exposure to covid-19: Secondary | ICD-10-CM | POA: Diagnosis present

## 2020-10-28 DIAGNOSIS — S93325A Dislocation of tarsometatarsal joint of left foot, initial encounter: Secondary | ICD-10-CM | POA: Diagnosis present

## 2020-10-28 DIAGNOSIS — S93326A Dislocation of tarsometatarsal joint of unspecified foot, initial encounter: Secondary | ICD-10-CM | POA: Diagnosis present

## 2020-10-28 DIAGNOSIS — Z720 Tobacco use: Secondary | ICD-10-CM | POA: Diagnosis not present

## 2020-10-28 MED ORDER — PENTOXIFYLLINE ER 400 MG PO TBCR
400.0000 mg | EXTENDED_RELEASE_TABLET | Freq: Three times a day (TID) | ORAL | Status: DC
  Administered 2020-10-28 – 2020-10-30 (×7): 400 mg via ORAL
  Filled 2020-10-28 (×8): qty 1

## 2020-10-28 MED ORDER — KETOROLAC TROMETHAMINE 30 MG/ML IJ SOLN
30.0000 mg | Freq: Once | INTRAMUSCULAR | Status: AC
  Administered 2020-10-28: 30 mg via INTRAVENOUS
  Filled 2020-10-28: qty 1

## 2020-10-28 NOTE — Evaluation (Signed)
Physical Therapy Evaluation Patient Details Name: Kent Day MRN: [id number] DOB: March 13, 1986 Today's Date: 10/28/2020   History of Present Illness  Kent Day is a  35 y.o. male with a diagnosis of Left Foot Fx who failed conservative measures and elected for surgical management, now s/p ORIF.  Clinical Impression  Orders received for PT evaluation. Patient demonstrates deficits in functional mobility as indicated below. Will benefit from continued skilled PT to address deficits and maximize function. Will see as indicated and progress as tolerated.      Follow Up Recommendations Follow surgeon's recommendation for DC plan and follow-up therapies    Equipment Recommendations  Crutches    Recommendations for Other Services       Precautions / Restrictions Precautions Precautions: Other (comment) Precaution Comments: wound vac Restrictions Weight Bearing Restrictions: Yes LLE Weight Bearing: Non weight bearing      Mobility  Bed Mobility Overal bed mobility: Modified Independent                  Transfers Overall transfer level: Needs assistance Equipment used: Crutches Transfers: Sit to/from Stand Sit to Stand: Min guard         General transfer comment: performed from multiple surfaces. Educated on techique for sit <> stand with crutches. VCs for hand placement  Ambulation/Gait Ambulation/Gait assistance: Min guard Gait Distance (Feet): 40 Feet Assistive device: Crutches Gait Pattern/deviations: Step-to pattern Gait velocity: decreased   General Gait Details: educated on use of crutches, min guard or safety and stabilit. Assisted with management of wound vac  Stairs            Wheelchair Mobility    Modified Rankin (Stroke Patients Only)       Balance Overall balance assessment: Needs assistance   Sitting balance-Leahy Scale: Good     Standing balance support: Bilateral upper extremity supported Standing balance-Leahy Scale:  Poor Standing balance comment: reliance on UE support due to NWBing LLE                             Pertinent Vitals/Pain Pain Assessment: 0-10 Pain Score: 6  Pain Location: left LE Pain Descriptors / Indicators: Sore;Throbbing Pain Intervention(s): Monitored during session    Home Living Family/patient expects to be discharged to:: Private residence Living Arrangements: Non-relatives/Friends Available Help at Discharge: Friend(s) Type of Home: Apartment Home Access: Level entry       Home Equipment: None      Prior Function Level of Independence: Independent               Hand Dominance   Dominant Hand: Right    Extremity/Trunk Assessment   Upper Extremity Assessment Upper Extremity Assessment: Overall WFL for tasks assessed    Lower Extremity Assessment Lower Extremity Assessment: LLE deficits/detail LLE: Unable to fully assess due to pain       Communication   Communication: No difficulties  Cognition Arousal/Alertness: Awake/alert Behavior During Therapy: WFL for tasks assessed/performed Overall Cognitive Status: Within Functional Limits for tasks assessed                                        General Comments      Exercises     Assessment/Plan    PT Assessment Patient needs continued PT services  PT Problem List Decreased activity tolerance;Decreased balance;Decreased mobility;Impaired sensation;Decreased skin integrity;Pain;Decreased knowledge of  use of DME       PT Treatment Interventions DME instruction;Gait training;Stair training;Functional mobility training;Therapeutic activities;Therapeutic exercise;Balance training;Patient/family education    PT Goals (Current goals can be found in the Care Plan section)  Acute Rehab PT Goals Patient Stated Goal: to go home PT Goal Formulation: With patient Time For Goal Achievement: 10/28/20 Potential to Achieve Goals: Good    Frequency Min 5X/week   Barriers  to discharge Decreased caregiver support      Co-evaluation               AM-PAC PT "6 Clicks" Mobility  Outcome Measure Help needed turning from your back to your side while in a flat bed without using bedrails?: None Help needed moving from lying on your back to sitting on the side of a flat bed without using bedrails?: None Help needed moving to and from a bed to a chair (including a wheelchair)?: A Little Help needed standing up from a chair using your arms (e.g., wheelchair or bedside chair)?: A Little Help needed to walk in hospital room?: A Little Help needed climbing 3-5 steps with a railing? : A Little 6 Click Score: 20    End of Session   Activity Tolerance: Patient tolerated treatment well Patient left: in chair;with call bell/phone within reach Nurse Communication: Mobility status PT Visit Diagnosis: Difficulty in walking, not elsewhere classified (R26.2)    Time: 2297-9892 PT Time Calculation (min) (ACUTE ONLY): 22 min   Charges:   PT Evaluation $PT Eval Moderate Complexity: 1 Mod          Charlotte Crumb, PT DPT  Board Certified Neurologic Specialist Acute Rehabilitation Services Office [phone]   Fabio Asa 10/28/2020, 10:58 AM

## 2020-10-28 NOTE — H&P (Signed)
Kent Day is an 35 y.o. male.   Chief Complaint: Left foot pain status post crush injury. HPI: Patient is a 35 year old gentleman who got his foot stuck between metal rollers that compressed and crushed his foot.  History reviewed. No pertinent past medical history.  Past Surgical History:  Procedure Laterality Date   COSMETIC SURGERY     Face    History reviewed. No pertinent family history. Social History:  reports that he has never smoked. His smokeless tobacco use includes chew. He reports current alcohol use. He reports current drug use. Drug: Marijuana.  Allergies: No Known Allergies  Medications Prior to Admission  Medication Sig Dispense Refill   ibuprofen (ADVIL) 200 MG tablet Take 200 mg by mouth every 6 (six) hours as needed for mild pain.      Results for orders placed or performed during the hospital encounter of 10/26/20 (from the past 48 hour(s))  Resp Panel by RT-PCR (Flu A&B, Covid) Nasopharyngeal Swab     Status: None   Collection Time: 10/26/20  9:02 PM   Specimen: Nasopharyngeal Swab; Nasopharyngeal(NP) swabs in vial transport medium  Result Value Ref Range   SARS Coronavirus 2 by RT PCR NEGATIVE NEGATIVE    Comment: (NOTE) SARS-CoV-2 target nucleic acids are NOT DETECTED.  The SARS-CoV-2 RNA is generally detectable in upper respiratory specimens during the acute phase of infection. The lowest concentration of SARS-CoV-2 viral copies this assay can detect is 138 copies/mL. A negative result does not preclude SARS-Cov-2 infection and should not be used as the sole basis for treatment or other patient management decisions. A negative result may occur with  improper specimen collection/handling, submission of specimen other than nasopharyngeal swab, presence of viral mutation(s) within the areas targeted by this assay, and inadequate number of viral copies(<138 copies/mL). A negative result must be combined with clinical observations, patient history,  and epidemiological information. The expected result is Negative.  Fact Sheet for Patients:  BloggerCourse.com  Fact Sheet for Healthcare Providers:  SeriousBroker.it  This test is no t yet approved or cleared by the [place] FDA and  has been authorized for detection and/or diagnosis of SARS-CoV-2 by FDA under an Emergency Use Authorization (EUA). This EUA will remain  in effect (meaning this test can be used) for the duration of the COVID-19 declaration under Section 564(b)(1) of the Act, 21 U.S.C.section 360bbb-3(b)(1), unless the authorization is terminated  or revoked sooner.       Influenza A by PCR NEGATIVE NEGATIVE   Influenza B by PCR NEGATIVE NEGATIVE    Comment: (NOTE) The Xpert Xpress SARS-CoV-2/FLU/RSV plus assay is intended as an aid in the diagnosis of influenza from Nasopharyngeal swab specimens and should not be used as a sole basis for treatment. Nasal washings and aspirates are unacceptable for Xpert Xpress SARS-CoV-2/FLU/RSV testing.  Fact Sheet for Patients: BloggerCourse.com  Fact Sheet for Healthcare Providers: SeriousBroker.it  This test is not yet approved or cleared by the [place] FDA and has been authorized for detection and/or diagnosis of SARS-CoV-2 by FDA under an Emergency Use Authorization (EUA). This EUA will remain in effect (meaning this test can be used) for the duration of the COVID-19 declaration under Section 564(b)(1) of the Act, 21 U.S.C. section 360bbb-3(b)(1), unless the authorization is terminated or revoked.  Performed at [facility], [address], [place], [place] [address]   Basic metabolic panel     Status: Abnormal   Collection Time: 10/26/20  9:43 PM  Result Value  Ref Range   Sodium 138 135 - 145 mmol/L   Potassium 3.4 (L) 3.5 - 5.1 mmol/L   Chloride 105 98 - 111 mmol/L   CO2 26 22 - 32 mmol/L    Glucose, Bld 127 (H) 70 - 99 mg/dL    Comment: Glucose reference range applies only to samples taken after fasting for at least 8 hours.   BUN 10 6 - 20 mg/dL   Creatinine, Ser 1.06 0.61 - 1.24 mg/dL   Calcium 8.5 (L) 8.9 - 10.3 mg/dL   GFR, Estimated >26 >94 mL/min    Comment: (NOTE) Calculated using the CKD-EPI Creatinine Equation (2021)    Anion gap 7 5 - 15    Comment: Performed at [facility], [address] [place], [place] [address]  CBC with Differential     Status: Abnormal   Collection Time: 10/26/20  9:43 PM  Result Value Ref Range   WBC 13.4 (H) 4.0 - 10.5 K/uL   RBC 4.60 4.22 - 5.81 MIL/uL   Hemoglobin 14.5 13.0 - 17.0 g/dL   HCT 70.3 50.0 - 93.8 %   MCV 91.7 80.0 - 100.0 fL   MCH 31.5 26.0 - 34.0 pg   MCHC 34.4 30.0 - 36.0 g/dL   RDW 18.2 99.3 - 71.6 %   Platelets 281 150 - 400 K/uL   nRBC 0.0 0.0 - 0.2 %   Neutrophils Relative % 86 %   Neutro Abs 11.4 (H) 1.7 - 7.7 K/uL   Lymphocytes Relative 7 %   Lymphs Abs 1.0 0.7 - 4.0 K/uL   Monocytes Relative 7 %   Monocytes Absolute 0.9 0.1 - 1.0 K/uL   Eosinophils Relative 0 %   Eosinophils Absolute 0.0 0.0 - 0.5 K/uL   Basophils Relative 0 %   Basophils Absolute 0.0 0.0 - 0.1 K/uL   Immature Granulocytes 0 %   Abs Immature Granulocytes 0.06 0.00 - 0.07 K/uL    Comment: Performed at [facility], [address], [place], [place] [address]  Surgical pcr screen     Status: Abnormal   Collection Time: 10/27/20  8:57 AM   Specimen: Nasal Mucosa; Nasal Swab  Result Value Ref Range   MRSA, PCR NEGATIVE NEGATIVE   Staphylococcus aureus POSITIVE (A) NEGATIVE    Comment: (NOTE) The Xpert SA Assay (FDA approved for NASAL specimens in patients 8 years of age and older), is one component of a comprehensive surveillance program. It is not intended to diagnose infection nor to guide or monitor treatment. Performed at [facility], [address] [place], [place] [address]    DG Ankle Complete  Left  Result Date: 10/26/2020 CLINICAL DATA:  Left foot injury at work. EXAM: LEFT ANKLE COMPLETE - 3+ VIEW COMPARISON:  None. FINDINGS: There is no evidence of fracture, dislocation, or joint effusion involving the left ankle. There is no evidence of arthropathy or other focal bone abnormality. Soft tissues are unremarkable. IMPRESSION: No abnormality seen involving the left ankle. Fractures and dislocations are seen involving the tarsometatarsal joints and fourth metatarsal as described on foot radiographs of same day. Electronically Signed   By: Lupita Raider M.D.   On: 10/26/2020 21:41   CT FOOT LEFT WO CONTRAST  Result Date: 10/27/2020 CLINICAL DATA:  Status post trauma. EXAM: CT OF THE LEFT FOOT WITHOUT CONTRAST TECHNIQUE: Multidetector CT imaging of the left foot was performed according to the standard protocol. Multiplanar CT image reconstructions were also generated. COMPARISON:  None. FINDINGS: Bones/Joint/Cartilage Acute fracture deformities are seen involving the distal aspect of the fourth left metatarsal, base of the second left metatarsal and plantar aspect of the left medial cuneiform bone. Plantar dislocation of the first, third, fourth and fifth left metatarsals is seen. While the second left metatarsal is not dislocated, plantar displacement of the distal fracture site is noted. Ligaments Suboptimally assessed by CT. Muscles and Tendons Limited in evaluation. Soft tissues Marked severity diffuse soft tissue swelling is seen throughout the left foot with an extensive amount of soft tissue edema and inflammatory fat stranding. IMPRESSION: Lisfranc fracture/dislocation of the left foot, as described above, with an additional fracture of the fourth left metatarsal. MRI correlation is recommended. Electronically Signed   By: Aram Candela M.D.   On: 10/27/2020 00:16   DG Foot Complete Left  Result Date: 10/26/2020 CLINICAL DATA:  Left foot pain after injury at work. EXAM: LEFT FOOT -  COMPLETE 3+ VIEW COMPARISON:  None. FINDINGS: Mildly displaced fracture is seen involving the distal fourth metatarsal. Lisfranc dislocations are seen involving the tarsometatarsal joints. IMPRESSION: Lisfranc dislocations are seen involving all 5 tarsometatarsal joints. Mildly displaced distal fourth metatarsal fracture is noted. Electronically Signed   By: Lupita Raider M.D.   On: 10/26/2020 21:40   DG MINI C-ARM IMAGE ONLY  Result Date: 10/27/2020 There is no interpretation for this exam.  This order is for images obtained during a surgical procedure.  Please See "Surgeries" Tab for more information regarding the procedure.    Review of Systems  All other systems reviewed and are negative.  Blood pressure 124/76, pulse 76, temperature 99.2 F (37.3 C), temperature source Oral, resp. rate 19, height 6' (1.829 m), weight 95.3 kg, SpO2 99 %. Physical Exam  Examination patient is alert oriented no adenopathy well-dressed normal affect normal respiratory effort he has a palpable dorsalis pedis pulse there are ischemic changes across the entire dorsum of the foot.  CT and radiographs shows complete dislocation across the Lisfranc joint with the metatarsals plantar to the cuneiforms.  Assessment/Plan Crush injury left foot with fracture dislocation and ischemic skin changes.  Plan: We will plan for open reduction internal fixation.  Discussed with the patient the critical step is going to be to see how the skin recovers.  Discussed that he already has ischemic skin changes and with full-thickness changes foot salvage intervention is going to be difficult.  Nadara Mustard, MD 10/28/2020, 11:17 AM

## 2020-10-28 NOTE — Progress Notes (Signed)
Patient c/o extreme pain at the operative site which not relieved by pain medicine.Dressing on the wound site loosened to help give comfort but unsuccessful. MD on call, Dr. Ophelia Charter notified. Instructed RN to trendelenburg patient and administer 1x dose of Inj. Toradol. See epic for order detail. Instructions carried out as ordered. Will continue to monitor.

## 2020-10-28 NOTE — Progress Notes (Signed)
Patient ID: Kent Day, male   DOB: 1985-04-02, 35 y.o.   MRN: [id number] Patient is postoperative day 1 internal fixation for Lisfranc fracture dislocation left foot.  Patient had extensive soft tissue crush injury which already had ischemic changes throughout the foot with the largest area dorsal medially.  The wound VAC is functioning well discussed the importance of nonweightbearing and elevation of his foot level with his heart.  Will evaluate for discharge to home on Monday.  Discussed that further surgical intervention will be necessary depending on the progression of the ischemic gangrenous changes from the crush injury.  Will add Trental to help with microcirculation.

## 2020-10-29 NOTE — Progress Notes (Signed)
Physical Therapy Treatment Patient Details Name: Kent Day MRN: [id number] DOB: 1985-07-18 Today's Date: 10/29/2020    History of Present Illness Kent Day is a  35 y.o. male adm 7/28 with a diagnosis of Left Foot Fx after crush injury at work. Pt underwent a lt foot ORIF on 7/29.    PT Comments    Pt making good progress mobilizing with crutches. Attempted to place CAM boot on pt's lt foot but edema and bulky dressing prevent foot from fitting into boot. May need a different type of brace/support.   Follow Up Recommendations  No PT follow up     Equipment Recommendations  Crutches    Recommendations for Other Services       Precautions / Restrictions Precautions Precautions: Other (comment) Precaution Comments: wound vac Restrictions Weight Bearing Restrictions: Yes LLE Weight Bearing: Non weight bearing    Mobility  Bed Mobility Overal bed mobility: Independent                  Transfers Overall transfer level: Modified independent Equipment used: Crutches Transfers: Sit to/from Stand Sit to Stand: Modified independent (Device/Increase time)            Ambulation/Gait Ambulation/Gait assistance: Supervision Gait Distance (Feet): 175 Feet Assistive device: Crutches Gait Pattern/deviations: Step-to pattern Gait velocity: decr Gait velocity interpretation: 1.31 - 2.62 ft/sec, indicative of limited community ambulator General Gait Details: Assist for safety.   Stairs Stairs: Yes Stairs assistance: Min guard Stair Management: With crutches;Step to pattern;Forwards Number of Stairs: 1 General stair comments: assist for safety and verbal cues for technique   Wheelchair Mobility    Modified Rankin (Stroke Patients Only)       Balance Overall balance assessment: Mild deficits observed, not formally tested                                          Cognition Arousal/Alertness: Awake/alert Behavior During Therapy:  WFL for tasks assessed/performed Overall Cognitive Status: Within Functional Limits for tasks assessed                                        Exercises      General Comments        Pertinent Vitals/Pain Pain Assessment: Faces Faces Pain Scale: Hurts little more Pain Location: lt foot and rt hip (sciatica) Pain Descriptors / Indicators: Sore;Throbbing Pain Intervention(s): Ice applied;Limited activity within patient's tolerance    Home Living                      Prior Function            PT Goals (current goals can now be found in the care plan section) Progress towards PT goals: Progressing toward goals    Frequency    Min 3X/week      PT Plan Discharge plan needs to be updated;Frequency needs to be updated    Co-evaluation              AM-PAC PT "6 Clicks" Mobility   Outcome Measure  Help needed turning from your back to your side while in a flat bed without using bedrails?: None Help needed moving from lying on your back to sitting on the side of a flat bed without using bedrails?: None Help  needed moving to and from a bed to a chair (including a wheelchair)?: None Help needed standing up from a chair using your arms (e.g., wheelchair or bedside chair)?: None Help needed to walk in hospital room?: A Little Help needed climbing 3-5 steps with a railing? : A Little 6 Click Score: 22    End of Session   Activity Tolerance: Patient tolerated treatment well Patient left: in bed;with call bell/phone within reach   PT Visit Diagnosis: Difficulty in walking, not elsewhere classified (R26.2)     Time: 7741-2878 PT Time Calculation (min) (ACUTE ONLY): 21 min  Charges:  $Gait Training: 8-22 mins                     [facility] PT Acute Rehabilitation Services Pager [phone] Office [phone]     Angelina Ok [facility] 10/29/2020, 2:07 PM

## 2020-10-29 NOTE — Progress Notes (Signed)
PT Note  Pt doing well with mobility. Full note to follow. Attempted to place CAM boot on pt's lt foot but edema and bulky dressing prevent foot from fitting into boot. Ortho MD can pt have a different option for that foot. Also pt reports he is having rt sciatic pain and is requesting icy hot/bengay type ointment for relief.   Thanks, [employer] PT Acute Rehabilitation Services Pager [phone] Office [phone]

## 2020-10-30 ENCOUNTER — Encounter (HOSPITAL_COMMUNITY): Payer: Self-pay | Admitting: Orthopedic Surgery

## 2020-10-30 MED ORDER — OXYCODONE HCL 5 MG PO TABS: 5.0000 mg | ORAL_TABLET | ORAL | 0 refills | Status: DC | PRN

## 2020-10-30 MED ORDER — PENTOXIFYLLINE ER 400 MG PO TBCR: 400.0000 mg | EXTENDED_RELEASE_TABLET | Freq: Three times a day (TID) | ORAL | 1 refills | Status: DC

## 2020-10-30 NOTE — Care Management (Signed)
Provided   Annice Needy, RN, Environmental health practitioner [address] [place], [place] [address]  C [phone]  F [phone] [email or web address]  With [employer] update.

## 2020-10-30 NOTE — Progress Notes (Signed)
RN gave pt discharge instructions and the patient stated understanding. IV has been removed, belongings have been packed waiting for his ride.

## 2020-10-30 NOTE — TOC CAGE-AID Note (Signed)
Transition of Care [facility]) - CAGE-AID Screening   Patient Details  Name: Kent Day MRN: [id number] Date of Birth: [birth year]  [facility]) CM/SW Contact:    Avleen Bordwell C Tarpley-Carter, LCSWA Phone Number: 10/30/2020, 10:36 AM   Clinical Narrative:  Pt participated in Cage-Aid.  Pt stated no use of substance or ETOH daily, only on special occassions/weekends.  Pt was offered resources.  Pt stated they did not feel that they were in need of resources at this time.  CSW will provide resources on dc summary for possible future use.  Virgie Chery Tarpley-Carter, MSW, LCSW-A Pronouns:  She/Her/Hers [facility]itions of [facility] Clinical Social Worker Direct Number:  [phone] [email or web address]   CAGE-AID Screening:      Have People Annoyed You By Critizing Your Drinking Or Drug Use?: No Have You Felt Bad Or Guilty About Your Drinking Or Drug Use?: No Have You Ever Had a Drink or Used Drugs First Thing In The Morning to Steady Your Nerves or to Get Rid of a Hangover?: No    Substance Abuse Education Offered: Yes  Substance abuse interventions: Transport planner

## 2020-10-30 NOTE — Progress Notes (Signed)
Orthopedic Tech Progress Note Patient Details:  Kent Day 21-Oct-1985 [id number]  Ortho Devices Type of Ortho Device: Postop shoe/boot Ortho Device/Splint Location: LLE Ortho Device/Splint Interventions: Ordered, Application, Adjustment   Post Interventions Patient Tolerated: Well Instructions Provided: Care of device  [name] 10/30/2020, 7:56 AM

## 2020-10-30 NOTE — Discharge Summary (Signed)
Discharge Diagnoses:  Active Problems:   Lisfranc dislocation, left, initial encounter   Lisfranc's dislocation   Surgeries: Procedure(s): OPEN REDUCTION INTERNAL FIXATION (ORIF) FOOT APPLICATION OF WOUND VAC on 10/27/2020    Consultants:   Discharged Condition: Improved  Hospital Course: Kent Day is an 35 y.o. male who was admitted 10/26/2020 with a chief complaint of left foot crushing injury with fracture, with a final diagnosis of Left Foot Fx.  Patient was brought to the operating room on 10/27/2020 and underwent Procedure(s): OPEN REDUCTION INTERNAL FIXATION (ORIF) FOOT APPLICATION OF WOUND VAC.    Patient was given perioperative antibiotics:  Anti-infectives (From admission, onward)    Start     Dose/Rate Route Frequency Ordered Stop   10/27/20 1600  ceFAZolin (ANCEF) IVPB 1 g/50 mL premix        1 g 100 mL/hr over 30 Minutes Intravenous Every 6 hours 10/27/20 1415 10/28/20 0410     .  Patient was given sequential compression devices, early ambulation, and aspirin for DVT prophylaxis.  Recent vital signs: Patient Vitals for the past 24 hrs:  BP Temp Temp src Pulse Resp SpO2  10/30/20 0450 132/77 98 F (36.7 C) Oral 65 18 96 %  10/30/20 0019 125/69 99.8 F (37.7 C) Oral 80 18 97 %  10/29/20 1708 123/68 98.3 F (36.8 C) Oral 73 18 100 %  10/29/20 1158 114/61 99.9 F (37.7 C) Oral 82 18 100 %  .  Recent laboratory studies: No results found.  Discharge Medications:   Allergies as of 10/30/2020   No Known Allergies      Medication List     TAKE these medications    ibuprofen 200 MG tablet Commonly known as: ADVIL Take 200 mg by mouth every 6 (six) hours as needed for mild pain.   oxyCODONE 5 MG immediate release tablet Commonly known as: Oxy IR/ROXICODONE Take 1 tablet (5 mg total) by mouth every 4 (four) hours as needed for moderate pain (pain score 4-6).   pentoxifylline 400 MG CR tablet Commonly known as: TRENTAL Take 1 tablet (400 mg total)  by mouth 3 (three) times daily with meals.        Diagnostic Studies: DG Ankle Complete Left  Result Date: 10/26/2020 CLINICAL DATA:  Left foot injury at work. EXAM: LEFT ANKLE COMPLETE - 3+ VIEW COMPARISON:  None. FINDINGS: There is no evidence of fracture, dislocation, or joint effusion involving the left ankle. There is no evidence of arthropathy or other focal bone abnormality. Soft tissues are unremarkable. IMPRESSION: No abnormality seen involving the left ankle. Fractures and dislocations are seen involving the tarsometatarsal joints and fourth metatarsal as described on foot radiographs of same day. Electronically Signed   By: Lupita Raider M.D.   On: 10/26/2020 21:41   CT FOOT LEFT WO CONTRAST  Result Date: 10/27/2020 CLINICAL DATA:  Status post trauma. EXAM: CT OF THE LEFT FOOT WITHOUT CONTRAST TECHNIQUE: Multidetector CT imaging of the left foot was performed according to the standard protocol. Multiplanar CT image reconstructions were also generated. COMPARISON:  None. FINDINGS: Bones/Joint/Cartilage Acute fracture deformities are seen involving the distal aspect of the fourth left metatarsal, base of the second left metatarsal and plantar aspect of the left medial cuneiform bone. Plantar dislocation of the first, third, fourth and fifth left metatarsals is seen. While the second left metatarsal is not dislocated, plantar displacement of the distal fracture site is noted. Ligaments Suboptimally assessed by CT. Muscles and Tendons Limited in evaluation. Soft tissues  Marked severity diffuse soft tissue swelling is seen throughout the left foot with an extensive amount of soft tissue edema and inflammatory fat stranding. IMPRESSION: Lisfranc fracture/dislocation of the left foot, as described above, with an additional fracture of the fourth left metatarsal. MRI correlation is recommended. Electronically Signed   By: Aram Candela M.D.   On: 10/27/2020 00:16   DG Foot Complete  Left  Result Date: 10/26/2020 CLINICAL DATA:  Left foot pain after injury at work. EXAM: LEFT FOOT - COMPLETE 3+ VIEW COMPARISON:  None. FINDINGS: Mildly displaced fracture is seen involving the distal fourth metatarsal. Lisfranc dislocations are seen involving the tarsometatarsal joints. IMPRESSION: Lisfranc dislocations are seen involving all 5 tarsometatarsal joints. Mildly displaced distal fourth metatarsal fracture is noted. Electronically Signed   By: Lupita Raider M.D.   On: 10/26/2020 21:40   DG MINI C-ARM IMAGE ONLY  Result Date: 10/27/2020 There is no interpretation for this exam.  This order is for images obtained during a surgical procedure.  Please See "Surgeries" Tab for more information regarding the procedure.    Patient benefited maximally from their hospital stay and there were no complications.     Disposition: Discharge disposition: 01-Home or Self Care      Discharge Instructions     Call MD / Call 911   Complete by: As directed    If you experience chest pain or shortness of breath, CALL 911 and be transported to the hospital emergency room.  If you develope a fever above 101 F, pus (white drainage) or increased drainage or redness at the wound, or calf pain, call your surgeon's office.   Constipation Prevention   Complete by: As directed    Drink plenty of fluids.  Prune juice may be helpful.  You may use a stool softener, such as Colace (over the counter) 100 mg twice a day.  Use MiraLax (over the counter) for constipation as needed.   Diet - low sodium heart healthy   Complete by: As directed    Discharge instructions   Complete by: As directed    Nonweight bearing. Call office if vac alarms or stops working   Increase activity slowly as tolerated   Complete by: As directed    Negative Pressure Wound Therapy - Incisional   Complete by: As directed    Negative Pressure Wound Therapy - Incisional   Complete by: As directed    Show patient how to attach vac    Post-operative opioid taper instructions:   Complete by: As directed    POST-OPERATIVE OPIOID TAPER INSTRUCTIONS: It is important to wean off of your opioid medication as soon as possible. If you do not need pain medication after your surgery it is ok to stop day one. Opioids include: Codeine, Hydrocodone(Norco, Vicodin), Oxycodone(Percocet, oxycontin) and hydromorphone amongst others.  Long term and even short term use of opiods can cause: Increased pain response Dependence Constipation Depression Respiratory depression And more.  Withdrawal symptoms can include Flu like symptoms Nausea, vomiting And more Techniques to manage these symptoms Hydrate well Eat regular healthy meals Stay active Use relaxation techniques(deep breathing, meditating, yoga) Do Not substitute Alcohol to help with tapering If you have been on opioids for less than two weeks and do not have pain than it is ok to stop all together.  Plan to wean off of opioids This plan should start within one week post op of your joint replacement. Maintain the same interval or time between taking each dose and  first decrease the dose.  Cut the total daily intake of opioids by one tablet each day Next start to increase the time between doses. The last dose that should be eliminated is the evening dose.          Follow-up Information     Nadara Mustard, MD Follow up in 1 week(s).   Specialty: Orthopedic Surgery Contact information: [address] [place] [address] [phone]                  Signed: [place] Jefte Carithers 10/30/2020, 7:41 AM

## 2020-10-30 NOTE — Progress Notes (Signed)
Patient is postop day 3 status post crush injuries sustained at work to his left foot.  Status post open reduction internal fixation.  Patient is alert feeling fine.  There is agreeable to discharging today   Vital signs stable afebrile wound VAC is in place 0 cc functioning well Praveena x7 days left.   Patiently discharged on tramadol and Percocet should remain nonweightbearing we will order postop shoe as he has not obtained this yet follow-up in our office in 1 week

## 2020-10-31 NOTE — Anesthesia Postprocedure Evaluation (Signed)
Anesthesia Post Note  Patient: Kent Day  Procedure(s) Performed: OPEN REDUCTION INTERNAL FIXATION (ORIF) FOOT (Left: Foot) APPLICATION OF WOUND VAC (Left: Foot)     Patient location during evaluation: PACU Anesthesia Type: Regional and General Level of consciousness: awake and alert Pain management: pain level controlled Vital Signs Assessment: post-procedure vital signs reviewed and stable Respiratory status: spontaneous breathing, nonlabored ventilation, respiratory function stable and patient connected to nasal cannula oxygen Cardiovascular status: blood pressure returned to baseline and stable Postop Assessment: no apparent nausea or vomiting Anesthetic complications: no   No notable events documented.  Last Vitals:  Vitals:   10/30/20 0450 10/30/20 1224  BP: 132/77 131/68  Pulse: 65 77  Resp: 18 16  Temp: 36.7 C 37.3 C  SpO2: 96% 100%    Last Pain:  Vitals:   10/30/20 1224  TempSrc: Oral  PainSc:                  Gaspar Fowle

## 2020-11-02 ENCOUNTER — Other Ambulatory Visit: Payer: Self-pay

## 2020-11-02 ENCOUNTER — Telehealth: Payer: Self-pay | Admitting: Orthopedic Surgery

## 2020-11-02 NOTE — Telephone Encounter (Signed)
[employer] forms received. To [employer]. 

## 2020-11-06 ENCOUNTER — Encounter: Payer: Self-pay | Admitting: Physician Assistant

## 2020-11-06 ENCOUNTER — Ambulatory Visit (INDEPENDENT_AMBULATORY_CARE_PROVIDER_SITE_OTHER): Payer: Worker's Compensation | Admitting: Physician Assistant

## 2020-11-06 DIAGNOSIS — S93324D Dislocation of tarsometatarsal joint of right foot, subsequent encounter: Secondary | ICD-10-CM

## 2020-11-06 NOTE — Progress Notes (Signed)
Office Visit Note   Patient: Kent Day           Date of Birth: March 13, 1986           MRN: [id number] Visit Date: 11/06/2020              Requested by: No referring provider defined for this encounter. PCP: Pcp, No  Chief Complaint  Patient presents with   Left Foot - Routine Post Op    10/27/20 ORIF left foot       HPI: Patient is 1 week status post ORIF of his left foot fracture that he sustained at work after a crushing injury.  He has been nonweightbearing.  He denies fever chills or calf pain  Assessment & Plan: Visit Diagnoses: No diagnosis found.  Plan: Patient was given a prescription for vive compression socks.  Specifically the socks are important to help with the swelling as well as healing process as the socks are medicated.  This makes some different from basic compression socks.  Follow-up in 1 week.  At that time x-ray should be taken  Follow-Up Instructions: No follow-ups on file.   Ortho Exam  Patient is alert, oriented, no adenopathy, well-dressed, normal affect, normal respiratory effort. Examination he has moderate amount of soft tissue swelling surgical sutures are in place well apposed wound edges no necrosis he has compartments that are soft and nontender he has an ecchymotic area from the crush injury that was present prior to surgery.  No ascending cellulitis or signs of infection  Imaging: No results found. No images are attached to the encounter.  Labs: No results found for: HGBA1C, ESRSEDRATE, CRP, LABURIC, REPTSTATUS, GRAMSTAIN, CULT, LABORGA   No results found for: ALBUMIN, PREALBUMIN, CBC  No results found for: MG No results found for: VD25OH  No results found for: PREALBUMIN CBC EXTENDED Latest Ref Rng & Units 10/26/2020  WBC 4.0 - 10.5 K/uL 13.4(H)  RBC 4.22 - 5.81 MIL/uL 4.60  HGB 13.0 - 17.0 g/dL 71.0  HCT 62.6 - 94.8 % 42.2  PLT 150 - 400 K/uL 281  NEUTROABS 1.7 - 7.7 K/uL 11.4(H)  LYMPHSABS 0.7 - 4.0 K/uL 1.0      There is no height or weight on file to calculate BMI.  Orders:  No orders of the defined types were placed in this encounter.  No orders of the defined types were placed in this encounter.    Procedures: No procedures performed  Clinical Data: No additional findings.  ROS:  All other systems negative, except as noted in the HPI. Review of Systems  Objective: Vital Signs: There were no vitals taken for this visit.  Specialty Comments:  No specialty comments available.  PMFS History: Patient Active Problem List   Diagnosis Date Noted   Lisfranc's dislocation 10/28/2020   Lisfranc dislocation, left, initial encounter 10/27/2020   Crushing injury of left foot    No past medical history on file.  No family history on file.  Past Surgical History:  Procedure Laterality Date   APPLICATION OF WOUND VAC Left 10/27/2020   Procedure: APPLICATION OF WOUND VAC;  Surgeon: Nadara Mustard, MD;  Location: [facility] OR;  Service: Orthopedics;  Laterality: Left;   COSMETIC SURGERY     Face   OPEN REDUCTION INTERNAL FIXATION (ORIF) METACARPAL Left 10/27/2020   Procedure: OPEN REDUCTION INTERNAL FIXATION (ORIF) FOOT;  Surgeon: Nadara Mustard, MD;  Location: [facility] OR;  Service: Orthopedics;  Laterality: Left;   Social History  Occupational History   Not on file  Tobacco Use   Smoking status: Never   Smokeless tobacco: Current    Types: Chew  Vaping Use   Vaping Use: Never used  Substance and Sexual Activity   Alcohol use: Yes    Comment: occasional   Drug use: Yes    Types: Marijuana    Comment: occasional   Sexual activity: Not on file

## 2020-11-08 ENCOUNTER — Other Ambulatory Visit: Payer: Self-pay

## 2020-11-13 ENCOUNTER — Telehealth: Payer: Self-pay | Admitting: Orthopedic Surgery

## 2020-11-13 NOTE — Telephone Encounter (Signed)
Received call from patient stating Kent Day telling him they di not receive anything from [place]. [employer] sent in his forms 8/12. I advised patient and did refax forms [phone].

## 2020-11-14 ENCOUNTER — Ambulatory Visit (INDEPENDENT_AMBULATORY_CARE_PROVIDER_SITE_OTHER): Payer: Worker's Compensation | Admitting: Orthopedic Surgery

## 2020-11-14 ENCOUNTER — Ambulatory Visit (INDEPENDENT_AMBULATORY_CARE_PROVIDER_SITE_OTHER): Payer: Worker's Compensation

## 2020-11-14 ENCOUNTER — Other Ambulatory Visit: Payer: Self-pay

## 2020-11-14 DIAGNOSIS — S93325A Dislocation of tarsometatarsal joint of left foot, initial encounter: Secondary | ICD-10-CM | POA: Diagnosis not present

## 2020-11-14 DIAGNOSIS — S93324D Dislocation of tarsometatarsal joint of right foot, subsequent encounter: Secondary | ICD-10-CM

## 2020-11-21 ENCOUNTER — Encounter: Payer: Self-pay | Admitting: Orthopedic Surgery

## 2020-11-21 ENCOUNTER — Ambulatory Visit (INDEPENDENT_AMBULATORY_CARE_PROVIDER_SITE_OTHER): Payer: Worker's Compensation | Admitting: Orthopedic Surgery

## 2020-11-21 DIAGNOSIS — S93324D Dislocation of tarsometatarsal joint of right foot, subsequent encounter: Secondary | ICD-10-CM

## 2020-11-21 DIAGNOSIS — S9782XD Crushing injury of left foot, subsequent encounter: Secondary | ICD-10-CM

## 2020-11-21 MED ORDER — NITROGLYCERIN 0.2 MG/HR TD PT24: 0.2000 mg | MEDICATED_PATCH | Freq: Every day | TRANSDERMAL | 12 refills | Status: DC

## 2020-11-21 NOTE — Progress Notes (Signed)
Office Visit Note   Patient: Kent Day           Date of Birth: May 10, 1985           MRN: [id number] Visit Date: 11/21/2020              Requested by: No referring provider defined for this encounter. PCP: Pcp, No  Chief Complaint  Patient presents with   Left Foot - Follow-up      HPI: Patient is a 35 year old gentleman who presents in follow-up status post open reduction internal fixation for crush injury left foot.  Patient has had ischemic changes to the skin and shows progressive improvement.  He is currently using [place] tall.  Assessment & Plan: Visit Diagnoses:  1. Dislocation of tarsometatarsal joint of right foot, subsequent encounter   2. Crushing injury of left foot, subsequent encounter     Plan: Sutures harvested today a prescription called in for nitroglycerin to be applied anteriorly to the ankle in a different location change daily after cleansing.  Continue with a compression stocking continue nonweightbearing continue working on range of motion of the toes and ankle.  Note provided to continue out of work for 4 weeks.  Follow-Up Instructions: Return in about 1 week (around 11/28/2020).   Ortho Exam  Patient is alert, oriented, no adenopathy, well-dressed, normal affect, normal respiratory effort. Examination the ischemic skin changes continues to improve there is now only 1 area of full-thickness black eschar.  This appears superficial.  The incision is well-healed and we will harvest the sutures today.  Imaging: No results found. No images are attached to the encounter.  Labs: No results found for: HGBA1C, ESRSEDRATE, CRP, LABURIC, REPTSTATUS, GRAMSTAIN, CULT, LABORGA   No results found for: ALBUMIN, PREALBUMIN, CBC  No results found for: MG No results found for: VD25OH  No results found for: PREALBUMIN CBC EXTENDED Latest Ref Rng & Units 10/26/2020  WBC 4.0 - 10.5 K/uL 13.4(H)  RBC 4.22 - 5.81 MIL/uL 4.60  HGB 13.0 - 17.0 g/dL 40.9  HCT  73.5 - 32.9 % 42.2  PLT 150 - 400 K/uL 281  NEUTROABS 1.7 - 7.7 K/uL 11.4(H)  LYMPHSABS 0.7 - 4.0 K/uL 1.0     There is no height or weight on file to calculate BMI.  Orders:  No orders of the defined types were placed in this encounter.  No orders of the defined types were placed in this encounter.    Procedures: No procedures performed  Clinical Data: No additional findings.  ROS:  All other systems negative, except as noted in the HPI. Review of Systems  Objective: Vital Signs: There were no vitals taken for this visit.  Specialty Comments:  No specialty comments available.  PMFS History: Patient Active Problem List   Diagnosis Date Noted   Lisfranc's dislocation 10/28/2020   Lisfranc dislocation, left, initial encounter 10/27/2020   Crushing injury of left foot    History reviewed. No pertinent past medical history.  History reviewed. No pertinent family history.  Past Surgical History:  Procedure Laterality Date   APPLICATION OF WOUND VAC Left 10/27/2020   Procedure: APPLICATION OF WOUND VAC;  Surgeon: Nadara Mustard, MD;  Location: [facility];  Service: Orthopedics;  Laterality: Left;   COSMETIC SURGERY     Face   OPEN REDUCTION INTERNAL FIXATION (ORIF) METACARPAL Left 10/27/2020   Procedure: OPEN REDUCTION INTERNAL FIXATION (ORIF) FOOT;  Surgeon: Nadara Mustard, MD;  Location: [facility];  Service: Orthopedics;  Laterality:  Left;   Social History   Occupational History   Not on file  Tobacco Use   Smoking status: Never   Smokeless tobacco: Current    Types: Chew  Vaping Use   Vaping Use: Never used  Substance and Sexual Activity   Alcohol use: Yes    Comment: occasional   Drug use: Yes    Types: Marijuana    Comment: occasional   Sexual activity: Not on file

## 2020-11-23 ENCOUNTER — Encounter: Payer: Self-pay | Admitting: Orthopedic Surgery

## 2020-11-23 NOTE — Progress Notes (Signed)
Office Visit Note   Patient: Kent Day           Date of Birth: [birth year]           MRN: [id number] Visit Date: 11/14/2020              Requested by: No referring provider defined for this encounter. PCP: Pcp, No  Chief Complaint  Patient presents with   Left Foot - Routine Post Op    10/27/20 ORIF left foot       HPI: Patient is a 35 year old gentleman who presents 3 weeks status post open reduction internal fixation Lisfranc complex left foot secondary to crush injury.  He is currently wearing an ace compression wrap not using a compression sock.  He is nonweightbearing with crutches.  Currently using Trental.  Assessment & Plan: Visit Diagnoses:  1. Dislocation of tarsometatarsal joint of right foot, subsequent encounter     Plan: Again discussed the importance of swelling control.  Patient was given a new compression sock discussed importance of elevation passive movement of the ankle and toes follow-up in 1 week.  Patient was given a note to be out of work for 4 weeks and anticipate he will be out of work for a minimum of 3 months.  Follow-Up Instructions: No follow-ups on file.   Ortho Exam  Patient is alert, oriented, no adenopathy, well-dressed, normal affect, normal respiratory effort. Examination patient has ischemic ulcers dorsally around the left foot secondary to the crush injury.  The surgical incision is well approximated.  Imaging: No results found.    Labs: No results found for: HGBA1C, ESRSEDRATE, CRP, LABURIC, REPTSTATUS, GRAMSTAIN, CULT, LABORGA   No results found for: ALBUMIN, PREALBUMIN, CBC  No results found for: MG No results found for: VD25OH  No results found for: PREALBUMIN CBC EXTENDED Latest Ref Rng & Units 10/26/2020  WBC 4.0 - 10.5 K/uL 13.4(H)  RBC 4.22 - 5.81 MIL/uL 4.60  HGB 13.0 - 17.0 g/dL 70.6  HCT 23.7 - 62.8 % 42.2  PLT 150 - 400 K/uL 281  NEUTROABS 1.7 - 7.7 K/uL 11.4(H)  LYMPHSABS 0.7 - 4.0 K/uL 1.0     There  is no height or weight on file to calculate BMI.  Orders:  Orders Placed This Encounter  Procedures   XR Foot Complete Left   No orders of the defined types were placed in this encounter.    Procedures: No procedures performed  Clinical Data: No additional findings.  ROS:  All other systems negative, except as noted in the HPI. Review of Systems  Objective: Vital Signs: There were no vitals taken for this visit.  Specialty Comments:  No specialty comments available.  PMFS History: Patient Active Problem List   Diagnosis Date Noted   Lisfranc's dislocation 10/28/2020   Lisfranc dislocation, left, initial encounter 10/27/2020   Crushing injury of left foot    History reviewed. No pertinent past medical history.  History reviewed. No pertinent family history.  Past Surgical History:  Procedure Laterality Date   APPLICATION OF WOUND VAC Left 10/27/2020   Procedure: APPLICATION OF WOUND VAC;  Surgeon: Nadara Mustard, MD;  Location: [facility];  Service: Orthopedics;  Laterality: Left;   COSMETIC SURGERY     Face   OPEN REDUCTION INTERNAL FIXATION (ORIF) METACARPAL Left 10/27/2020   Procedure: OPEN REDUCTION INTERNAL FIXATION (ORIF) FOOT;  Surgeon: Nadara Mustard, MD;  Location: [facility];  Service: Orthopedics;  Laterality: Left;   Social History  Occupational History   Not on file  Tobacco Use   Smoking status: Never   Smokeless tobacco: Current    Types: Chew  Vaping Use   Vaping Use: Never used  Substance and Sexual Activity   Alcohol use: Yes    Comment: occasional   Drug use: Yes    Types: Marijuana    Comment: occasional   Sexual activity: Not on file

## 2020-11-27 ENCOUNTER — Ambulatory Visit (INDEPENDENT_AMBULATORY_CARE_PROVIDER_SITE_OTHER): Payer: Worker's Compensation | Admitting: Orthopedic Surgery

## 2020-11-27 ENCOUNTER — Other Ambulatory Visit: Payer: Self-pay

## 2020-11-27 ENCOUNTER — Encounter: Payer: Self-pay | Admitting: Orthopedic Surgery

## 2020-11-27 DIAGNOSIS — S93325D Dislocation of tarsometatarsal joint of left foot, subsequent encounter: Secondary | ICD-10-CM

## 2020-11-27 DIAGNOSIS — S93324D Dislocation of tarsometatarsal joint of right foot, subsequent encounter: Secondary | ICD-10-CM

## 2020-11-27 DIAGNOSIS — S9782XD Crushing injury of left foot, subsequent encounter: Secondary | ICD-10-CM

## 2020-11-27 NOTE — Progress Notes (Signed)
Office Visit Note   Patient: Kent Day           Date of Birth: 12/15/1985           MRN: [id number] Visit Date: 11/27/2020              Requested by: No referring provider defined for this encounter. PCP: Pcp, No  Chief Complaint  Patient presents with   Right Foot - Follow-up      HPI: Patient is a 35 year old gentleman status post open duction internal fixation left midfoot status post crush injury.  Patient is using a nitroglycerin patch and Trental and a compression stocking.  Assessment & Plan: Visit Diagnoses:  1. Dislocation of tarsometatarsal joint of right foot, subsequent encounter   2. Crushing injury of left foot, subsequent encounter     Plan: Continue with the compression Nitropatch and Trental.  Again reinforced the importance of dorsiflexion stretching of the ankle and again discussed the importance of manually moving the toes to break up the adhesions to the tendons.  Continue with elevation continue nonweightbearing.  Three-view radiographs of the left foot at follow-up.  Follow-Up Instructions: Return in about 1 week (around 12/04/2020).   Ortho Exam  Patient is alert, oriented, no adenopathy, well-dressed, normal affect, normal respiratory effort. Examination the swelling has decreased the superficial ischemic skin ulcers are healing there is still 1 large area of black eschar dorsal medially.  There is no cellulitis no odor no drainage no signs of infection.  Imaging: No results found. No images are attached to the encounter.  Labs: No results found for: HGBA1C, ESRSEDRATE, CRP, LABURIC, REPTSTATUS, GRAMSTAIN, CULT, LABORGA   No results found for: ALBUMIN, PREALBUMIN, CBC  No results found for: MG No results found for: VD25OH  No results found for: PREALBUMIN CBC EXTENDED Latest Ref Rng & Units 10/26/2020  WBC 4.0 - 10.5 K/uL 13.4(H)  RBC 4.22 - 5.81 MIL/uL 4.60  HGB 13.0 - 17.0 g/dL 50.0  HCT 37.0 - 48.8 % 42.2  PLT 150 - 400 K/uL  281  NEUTROABS 1.7 - 7.7 K/uL 11.4(H)  LYMPHSABS 0.7 - 4.0 K/uL 1.0     There is no height or weight on file to calculate BMI.  Orders:  No orders of the defined types were placed in this encounter.  No orders of the defined types were placed in this encounter.    Procedures: No procedures performed  Clinical Data: No additional findings.  ROS:  All other systems negative, except as noted in the HPI. Review of Systems  Objective: Vital Signs: There were no vitals taken for this visit.  Specialty Comments:  No specialty comments available.  PMFS History: Patient Active Problem List   Diagnosis Date Noted   Lisfranc's dislocation 10/28/2020   Lisfranc dislocation, left, initial encounter 10/27/2020   Crushing injury of left foot    History reviewed. No pertinent past medical history.  History reviewed. No pertinent family history.  Past Surgical History:  Procedure Laterality Date   APPLICATION OF WOUND VAC Left 10/27/2020   Procedure: APPLICATION OF WOUND VAC;  Surgeon: Nadara Mustard, MD;  Location: [facility];  Service: Orthopedics;  Laterality: Left;   COSMETIC SURGERY     Face   OPEN REDUCTION INTERNAL FIXATION (ORIF) METACARPAL Left 10/27/2020   Procedure: OPEN REDUCTION INTERNAL FIXATION (ORIF) FOOT;  Surgeon: Nadara Mustard, MD;  Location: [facility];  Service: Orthopedics;  Laterality: Left;   Social History   Occupational History  Not on file  Tobacco Use   Smoking status: Never   Smokeless tobacco: Current    Types: Chew  Vaping Use   Vaping Use: Never used  Substance and Sexual Activity   Alcohol use: Yes    Comment: occasional   Drug use: Yes    Types: Marijuana    Comment: occasional   Sexual activity: Not on file

## 2020-12-05 ENCOUNTER — Ambulatory Visit (INDEPENDENT_AMBULATORY_CARE_PROVIDER_SITE_OTHER): Payer: Worker's Compensation | Admitting: Orthopedic Surgery

## 2020-12-05 ENCOUNTER — Ambulatory Visit: Payer: Self-pay

## 2020-12-05 ENCOUNTER — Other Ambulatory Visit: Payer: Self-pay

## 2020-12-05 ENCOUNTER — Ambulatory Visit (INDEPENDENT_AMBULATORY_CARE_PROVIDER_SITE_OTHER): Payer: Worker's Compensation

## 2020-12-05 DIAGNOSIS — S9782XD Crushing injury of left foot, subsequent encounter: Secondary | ICD-10-CM | POA: Diagnosis not present

## 2020-12-05 DIAGNOSIS — S93324D Dislocation of tarsometatarsal joint of right foot, subsequent encounter: Secondary | ICD-10-CM

## 2020-12-13 ENCOUNTER — Other Ambulatory Visit: Payer: Self-pay | Admitting: Physician Assistant

## 2020-12-14 ENCOUNTER — Other Ambulatory Visit: Payer: Self-pay

## 2020-12-14 MED ORDER — PENTOXIFYLLINE ER 400 MG PO TBCR: 400.0000 mg | EXTENDED_RELEASE_TABLET | Freq: Three times a day (TID) | ORAL | 0 refills | Status: DC

## 2020-12-18 ENCOUNTER — Ambulatory Visit (INDEPENDENT_AMBULATORY_CARE_PROVIDER_SITE_OTHER): Payer: Worker's Compensation | Admitting: Orthopedic Surgery

## 2020-12-18 ENCOUNTER — Encounter: Payer: Self-pay | Admitting: Orthopedic Surgery

## 2020-12-18 ENCOUNTER — Ambulatory Visit (INDEPENDENT_AMBULATORY_CARE_PROVIDER_SITE_OTHER): Payer: Worker's Compensation

## 2020-12-18 DIAGNOSIS — S93325D Dislocation of tarsometatarsal joint of left foot, subsequent encounter: Secondary | ICD-10-CM | POA: Diagnosis not present

## 2020-12-18 DIAGNOSIS — S9782XD Crushing injury of left foot, subsequent encounter: Secondary | ICD-10-CM

## 2020-12-18 DIAGNOSIS — S93324D Dislocation of tarsometatarsal joint of right foot, subsequent encounter: Secondary | ICD-10-CM

## 2020-12-18 NOTE — Progress Notes (Signed)
Office Visit Note   Patient: Kent Day           Date of Birth: 1985-09-23           MRN: [id number] Visit Date: 12/18/2020              Requested by: No referring provider defined for this encounter. PCP: Pcp, No  Chief Complaint  Patient presents with   Left Foot - Routine Post Op    10/27/20 left foot ORIF       HPI: Patient is a 35 year old gentleman who presents 6 weeks status post open reduction internal fixation right foot Lisfranc fracture dislocation from a crush injury.  Patient is currently ambulating with crutches using a postoperative shoe and compression socks.  Assessment & Plan: Visit Diagnoses:  1. Dislocation of tarsometatarsal joint of right foot, subsequent encounter   2. Crushing injury of left foot, subsequent encounter     Plan: We will continue with compression continue with Trental continue with the nitroglycerin patch.  Patient still has a large eschar that appears to be healing slowly without signs of infection.  Patient is given a prescription to return to seated sedentary work for 4 weeks.  Patient was also given a prescription for physical therapy for ankle range of motion and strengthening.  Follow-Up Instructions: Return in about 4 weeks (around 01/15/2021).   Ortho Exam  Patient is alert, oriented, no adenopathy, well-dressed, normal affect, normal respiratory effort. Examination patient still has swelling of the left foot.  The superficial eschar continues to resolve.  Patient still has 1 area of large eschar over the medial column of the ankle that measures 3 x 4 cm.  There is no cellulitis no drainage no odor there is good epithelization around the wound edges.  With dorsiflexion the ankle patient only has dorsiflexion to neutral with Achilles contracture as well as decreased range of motion of his toes.  Imaging: XR Foot Complete Left  Result Date: 12/18/2020 Three-view radiographs of the left foot shows stable internal fixation  across the Lisfranc complex.  No lucency no loss of reduction.  No images are attached to the encounter.  Labs: No results found for: HGBA1C, ESRSEDRATE, CRP, LABURIC, REPTSTATUS, GRAMSTAIN, CULT, LABORGA   No results found for: ALBUMIN, PREALBUMIN, CBC  No results found for: MG No results found for: VD25OH  No results found for: PREALBUMIN CBC EXTENDED Latest Ref Rng & Units 10/26/2020  WBC 4.0 - 10.5 K/uL 13.4(H)  RBC 4.22 - 5.81 MIL/uL 4.60  HGB 13.0 - 17.0 g/dL 62.5  HCT 63.8 - 93.7 % 42.2  PLT 150 - 400 K/uL 281  NEUTROABS 1.7 - 7.7 K/uL 11.4(H)  LYMPHSABS 0.7 - 4.0 K/uL 1.0     There is no height or weight on file to calculate BMI.  Orders:  Orders Placed This Encounter  Procedures   XR Foot Complete Left   No orders of the defined types were placed in this encounter.    Procedures: No procedures performed  Clinical Data: No additional findings.  ROS:  All other systems negative, except as noted in the HPI. Review of Systems  Objective: Vital Signs: There were no vitals taken for this visit.  Specialty Comments:  No specialty comments available.  PMFS History: Patient Active Problem List   Diagnosis Date Noted   Lisfranc's dislocation 10/28/2020   Lisfranc dislocation, left, initial encounter 10/27/2020   Crushing injury of left foot    History reviewed. No pertinent past medical  history.  History reviewed. No pertinent family history.  Past Surgical History:  Procedure Laterality Date   APPLICATION OF WOUND VAC Left 10/27/2020   Procedure: APPLICATION OF WOUND VAC;  Surgeon: Nadara Mustard, MD;  Location: [facility] OR;  Service: Orthopedics;  Laterality: Left;   COSMETIC SURGERY     Face   OPEN REDUCTION INTERNAL FIXATION (ORIF) METACARPAL Left 10/27/2020   Procedure: OPEN REDUCTION INTERNAL FIXATION (ORIF) FOOT;  Surgeon: Nadara Mustard, MD;  Location: [facility] OR;  Service: Orthopedics;  Laterality: Left;   Social History   Occupational History   Not  on file  Tobacco Use   Smoking status: Never   Smokeless tobacco: Current    Types: Chew  Vaping Use   Vaping Use: Never used  Substance and Sexual Activity   Alcohol use: Yes    Comment: occasional   Drug use: Yes    Types: Marijuana    Comment: occasional   Sexual activity: Not on file

## 2020-12-18 NOTE — Progress Notes (Signed)
Office Visit Note   Patient: Kent Day           Date of Birth: Sep 23, 1985           MRN: [id number] Visit Date: 12/05/2020              Requested by: No referring provider defined for this encounter. PCP: Pcp, No  Chief Complaint  Patient presents with   Left Foot - Routine Post Op    10/27/20 ORIF left foot          HPI: Patient is a 35 year old gentleman who presents 5 weeks status post open reduction internal fixation Lisfranc fracture dislocation fusion.  Patient is currently on Trental and nitroglycerin to improve microcirculation.  Assessment & Plan: Visit Diagnoses:  1. Dislocation of tarsometatarsal joint of right foot, subsequent encounter   2. Crushing injury of left foot, subsequent encounter     Plan: Discussed the importance of working on active and passive dorsiflexion of the ankles and toes.  He will advance to weightbearing as tolerated with crutches and anticipate advancing to seated work at follow-up.  Follow-Up Instructions: Return in about 2 weeks (around 12/19/2020).   Ortho Exam  Patient is alert, oriented, no adenopathy, well-dressed, normal affect, normal respiratory effort. Examination the swelling is decreasing the surgical incision is healing.  No cellulitis.  Patient does have tightness in both the Achilles and range of motion of the toes.  Imaging: No results found. No images are attached to the encounter.  Labs: No results found for: HGBA1C, ESRSEDRATE, CRP, LABURIC, REPTSTATUS, GRAMSTAIN, CULT, LABORGA   No results found for: ALBUMIN, PREALBUMIN, CBC  No results found for: MG No results found for: VD25OH  No results found for: PREALBUMIN CBC EXTENDED Latest Ref Rng & Units 10/26/2020  WBC 4.0 - 10.5 K/uL 13.4(H)  RBC 4.22 - 5.81 MIL/uL 4.60  HGB 13.0 - 17.0 g/dL 57.8  HCT 46.9 - 62.9 % 42.2  PLT 150 - 400 K/uL 281  NEUTROABS 1.7 - 7.7 K/uL 11.4(H)  LYMPHSABS 0.7 - 4.0 K/uL 1.0     There is no height or weight on file  to calculate BMI.  Orders:  Orders Placed This Encounter  Procedures   XR Foot Complete Left   No orders of the defined types were placed in this encounter.    Procedures: No procedures performed  Clinical Data: No additional findings.  ROS:  All other systems negative, except as noted in the HPI. Review of Systems  Objective: Vital Signs: There were no vitals taken for this visit.  Specialty Comments:  No specialty comments available.  PMFS History: Patient Active Problem List   Diagnosis Date Noted   Lisfranc's dislocation 10/28/2020   Lisfranc dislocation, left, initial encounter 10/27/2020   Crushing injury of left foot    History reviewed. No pertinent past medical history.  History reviewed. No pertinent family history.  Past Surgical History:  Procedure Laterality Date   APPLICATION OF WOUND VAC Left 10/27/2020   Procedure: APPLICATION OF WOUND VAC;  Surgeon: Nadara Mustard, MD;  Location: [facility] OR;  Service: Orthopedics;  Laterality: Left;   COSMETIC SURGERY     Face   OPEN REDUCTION INTERNAL FIXATION (ORIF) METACARPAL Left 10/27/2020   Procedure: OPEN REDUCTION INTERNAL FIXATION (ORIF) FOOT;  Surgeon: Nadara Mustard, MD;  Location: [facility] OR;  Service: Orthopedics;  Laterality: Left;   Social History   Occupational History   Not on file  Tobacco Use   Smoking status:  Never   Smokeless tobacco: Current    Types: Chew  Vaping Use   Vaping Use: Never used  Substance and Sexual Activity   Alcohol use: Yes    Comment: occasional   Drug use: Yes    Types: Marijuana    Comment: occasional   Sexual activity: Not on file

## 2021-01-16 ENCOUNTER — Encounter: Payer: Self-pay | Admitting: Orthopedic Surgery

## 2021-01-16 ENCOUNTER — Ambulatory Visit (INDEPENDENT_AMBULATORY_CARE_PROVIDER_SITE_OTHER): Payer: Worker's Compensation | Admitting: Orthopedic Surgery

## 2021-01-16 DIAGNOSIS — S9782XD Crushing injury of left foot, subsequent encounter: Secondary | ICD-10-CM | POA: Diagnosis not present

## 2021-01-16 NOTE — Progress Notes (Signed)
Office Visit Note   Patient: Kent Day           Date of Birth: 1985/11/08           MRN: [id number] Visit Date: 01/16/2021              Requested by: No referring provider defined for this encounter. PCP: Pcp, No  Chief Complaint  Patient presents with   Left Foot - Routine Post Op      HPI: Patient is a 35 year old gentleman who presents status post crush injury to left foot.  Patient has undergone surgical intervention serial debridement now using compression stockings and medical management to improve circulation to the foot.  Patient is stable at this time with a stable eschar and good periwound epithelialization.  Assessment & Plan: Visit Diagnoses:  1. Crushing injury of left foot, subsequent encounter     Plan: We will plan for application of Kerecis skin graft at this time we will plan for outpatient surgery with wound debridement wound bed preparation application of Kerecis powder and Kerecis skin graft application of a cleanse choice wound VAC sponge with the Praveena pump with discharge to home follow-up in the office in 1 week.  Patient is given a prescription for 8 additional physical therapy sessions.  Patient is given a note to continue seated light duty work.  Follow-Up Instructions: Return in about 1 week (around 01/23/2021).   Ortho Exam  Patient is alert, oriented, no adenopathy, well-dressed, normal affect, normal respiratory effort. Examinations patient's foot is stable there is excellent epithelization he has made great progress with therapy now has dorsiflexion of the ankle to neutral with his knee extended.  We will need to continue therapy.  After informed consent a 10 blade knife was used to debride and remove the eschar there is 50% granulation tissue and 50% fibrinous tissue.  The wound is 3 x 3 cm and 1 cm deep.  We will need to proceed now to application of a skin graft.  Imaging: No results found. No images are attached to the  encounter.  Labs: No results found for: HGBA1C, ESRSEDRATE, CRP, LABURIC, REPTSTATUS, GRAMSTAIN, CULT, LABORGA   No results found for: ALBUMIN, PREALBUMIN, CBC  No results found for: MG No results found for: VD25OH  No results found for: PREALBUMIN CBC EXTENDED Latest Ref Rng & Units 10/26/2020  WBC 4.0 - 10.5 K/uL 13.4(H)  RBC 4.22 - 5.81 MIL/uL 4.60  HGB 13.0 - 17.0 g/dL 22.0  HCT 25.4 - 27.0 % 42.2  PLT 150 - 400 K/uL 281  NEUTROABS 1.7 - 7.7 K/uL 11.4(H)  LYMPHSABS 0.7 - 4.0 K/uL 1.0     There is no height or weight on file to calculate BMI.  Orders:  No orders of the defined types were placed in this encounter.  No orders of the defined types were placed in this encounter.    Procedures: No procedures performed  Clinical Data: No additional findings.  ROS:  All other systems negative, except as noted in the HPI. Review of Systems  Objective: Vital Signs: There were no vitals taken for this visit.  Specialty Comments:  No specialty comments available.  PMFS History: Patient Active Problem List   Diagnosis Date Noted   Lisfranc's dislocation 10/28/2020   Lisfranc dislocation, left, initial encounter 10/27/2020   Crushing injury of left foot    History reviewed. No pertinent past medical history.  History reviewed. No pertinent family history.  Past Surgical History:  Procedure  Laterality Date   APPLICATION OF WOUND VAC Left 10/27/2020   Procedure: APPLICATION OF WOUND VAC;  Surgeon: Nadara Mustard, MD;  Location: [facility] OR;  Service: Orthopedics;  Laterality: Left;   COSMETIC SURGERY     Face   OPEN REDUCTION INTERNAL FIXATION (ORIF) METACARPAL Left 10/27/2020   Procedure: OPEN REDUCTION INTERNAL FIXATION (ORIF) FOOT;  Surgeon: Nadara Mustard, MD;  Location: [facility] OR;  Service: Orthopedics;  Laterality: Left;   Social History   Occupational History   Not on file  Tobacco Use   Smoking status: Never   Smokeless tobacco: Current    Types: Chew  Vaping  Use   Vaping Use: Never used  Substance and Sexual Activity   Alcohol use: Yes    Comment: occasional   Drug use: Yes    Types: Marijuana    Comment: occasional   Sexual activity: Not on file

## 2021-01-18 ENCOUNTER — Other Ambulatory Visit: Payer: Self-pay

## 2021-01-18 ENCOUNTER — Encounter (HOSPITAL_COMMUNITY): Payer: Self-pay | Admitting: Orthopedic Surgery

## 2021-01-18 NOTE — Progress Notes (Signed)
Spoke with pt for pre-op call. Pt denies cardiac history, HTN or Diabetes.   Pt's surgery is scheduled as ambulatory so no Covid test is required prior to surgery.  

## 2021-01-19 ENCOUNTER — Ambulatory Visit (HOSPITAL_COMMUNITY): Payer: Worker's Compensation | Admitting: Certified Registered Nurse Anesthetist

## 2021-01-19 ENCOUNTER — Encounter (HOSPITAL_COMMUNITY): Payer: Self-pay | Admitting: Orthopedic Surgery

## 2021-01-19 ENCOUNTER — Telehealth: Payer: Self-pay | Admitting: Orthopedic Surgery

## 2021-01-19 ENCOUNTER — Other Ambulatory Visit: Payer: Self-pay

## 2021-01-19 ENCOUNTER — Ambulatory Visit (HOSPITAL_COMMUNITY)
Admission: RE | Admit: 2021-01-19 | Discharge: 2021-01-19 | Disposition: A | Discharge status: Home / Self Care | Payer: Worker's Compensation | Attending: Orthopedic Surgery | Admitting: Orthopedic Surgery

## 2021-01-19 ENCOUNTER — Encounter (HOSPITAL_COMMUNITY)
Admission: RE | Disposition: A | Discharge status: Home / Self Care | Payer: Self-pay | Source: Home / Self Care | Attending: Orthopedic Surgery

## 2021-01-19 ENCOUNTER — Telehealth: Payer: Self-pay

## 2021-01-19 DIAGNOSIS — S9782XD Crushing injury of left foot, subsequent encounter: Secondary | ICD-10-CM

## 2021-01-19 DIAGNOSIS — S91302A Unspecified open wound, left foot, initial encounter: Secondary | ICD-10-CM | POA: Diagnosis present

## 2021-01-19 DIAGNOSIS — W230XXA Caught, crushed, jammed, or pinched between moving objects, initial encounter: Secondary | ICD-10-CM | POA: Diagnosis not present

## 2021-01-19 DIAGNOSIS — S9782XS Crushing injury of left foot, sequela: Secondary | ICD-10-CM

## 2021-01-19 HISTORY — DX: Unspecified osteoarthritis, unspecified site: M19.90

## 2021-01-19 HISTORY — DX: Anxiety disorder, unspecified: F41.9

## 2021-01-19 HISTORY — PX: I & D EXTREMITY: SHX5045

## 2021-01-19 LAB — BASIC METABOLIC PANEL
Anion gap: 8 (ref 5–15)
BUN: 15 mg/dL (ref 6–20)
CO2: 26 mmol/L (ref 22–32)
Calcium: 9.3 mg/dL (ref 8.9–10.3)
Chloride: 102 mmol/L (ref 98–111)
Creatinine, Ser: 0.85 mg/dL (ref 0.61–1.24)
GFR, Estimated: >60 mL/min (ref >60)
Glucose, Bld: 98 mg/dL (ref 70–99)
Potassium: 3.9 mmol/L (ref 3.5–5.1)
Sodium: 136 mmol/L (ref 135–145)

## 2021-01-19 LAB — TYPE AND SCREEN
ABO/RH(D): O POS
Antibody Screen: NEGATIVE

## 2021-01-19 LAB — CBC
HCT: 47.9 % (ref 39.0–52.0)
Hemoglobin: 15.7 g/dL (ref 13.0–17.0)
MCH: 29.4 pg (ref 26.0–34.0)
MCHC: 32.8 g/dL (ref 30.0–36.0)
MCV: 89.7 fL (ref 80.0–100.0)
Platelets: 277 10*3/uL (ref 150–400)
RBC: 5.34 MIL/uL (ref 4.22–5.81)
RDW: 12.3 % (ref 11.5–15.5)
WBC: 5.4 10*3/uL (ref 4.0–10.5)
nRBC: 0 % (ref 0.0–0.2)

## 2021-01-19 LAB — ABO/RH: ABO/RH(D): O POS

## 2021-01-19 SURGERY — IRRIGATION AND DEBRIDEMENT EXTREMITY
Anesthesia: General | Laterality: Left

## 2021-01-19 MED ORDER — CEFAZOLIN SODIUM-DEXTROSE 2-4 GM/100ML-% IV SOLN
2.0000 g | INTRAVENOUS | Status: AC
  Administered 2021-01-19: 2 g via INTRAVENOUS
  Filled 2021-01-19: qty 100

## 2021-01-19 MED ORDER — DEXAMETHASONE SODIUM PHOSPHATE 10 MG/ML IJ SOLN
INTRAMUSCULAR | Status: DC | PRN
  Administered 2021-01-19: 10 mg via INTRAVENOUS

## 2021-01-19 MED ORDER — MIDAZOLAM HCL 2 MG/2ML IJ SOLN
INTRAMUSCULAR | Status: DC | PRN
  Administered 2021-01-19: 2 mg via INTRAVENOUS

## 2021-01-19 MED ORDER — PROPOFOL 10 MG/ML IV BOLUS
INTRAVENOUS | Status: AC
  Filled 2021-01-19: qty 20

## 2021-01-19 MED ORDER — ONDANSETRON HCL 4 MG/2ML IJ SOLN
INTRAMUSCULAR | Status: DC | PRN
  Administered 2021-01-19: 4 mg via INTRAVENOUS

## 2021-01-19 MED ORDER — 0.9 % SODIUM CHLORIDE (POUR BTL) OPTIME
TOPICAL | Status: DC | PRN
  Administered 2021-01-19: 1000 mL

## 2021-01-19 MED ORDER — LIDOCAINE 2% (20 MG/ML) 5 ML SYRINGE
INTRAMUSCULAR | Status: DC | PRN
  Administered 2021-01-19: 100 mg via INTRAVENOUS

## 2021-01-19 MED ORDER — SODIUM CHLORIDE 0.45 % IV SOLN: INTRAVENOUS | Status: DC

## 2021-01-19 MED ORDER — OXYCODONE-ACETAMINOPHEN 5-325 MG PO TABS: 1.0000 | ORAL_TABLET | ORAL | 0 refills | Status: AC | PRN

## 2021-01-19 MED ORDER — CHLORHEXIDINE GLUCONATE 0.12 % MT SOLN
15.0000 mL | Freq: Once | OROMUCOSAL | Status: AC
  Administered 2021-01-19: 15 mL via OROMUCOSAL
  Filled 2021-01-19: qty 15

## 2021-01-19 MED ORDER — ORAL CARE MOUTH RINSE: 15.0000 mL | Freq: Once | OROMUCOSAL | Status: AC

## 2021-01-19 MED ORDER — PROPOFOL 10 MG/ML IV BOLUS
INTRAVENOUS | Status: DC | PRN
  Administered 2021-01-19: 200 mg via INTRAVENOUS

## 2021-01-19 MED ORDER — LIDOCAINE 2% (20 MG/ML) 5 ML SYRINGE
INTRAMUSCULAR | Status: AC
  Filled 2021-01-19: qty 5

## 2021-01-19 MED ORDER — DEXAMETHASONE SODIUM PHOSPHATE 10 MG/ML IJ SOLN
INTRAMUSCULAR | Status: AC
  Filled 2021-01-19: qty 1

## 2021-01-19 MED ORDER — MIDAZOLAM HCL 2 MG/2ML IJ SOLN
INTRAMUSCULAR | Status: AC
  Filled 2021-01-19: qty 2

## 2021-01-19 MED ORDER — FENTANYL CITRATE (PF) 100 MCG/2ML IJ SOLN
INTRAMUSCULAR | Status: DC | PRN
  Administered 2021-01-19: 100 ug via INTRAVENOUS

## 2021-01-19 MED ORDER — ONDANSETRON HCL 4 MG/2ML IJ SOLN
INTRAMUSCULAR | Status: AC
  Filled 2021-01-19: qty 2

## 2021-01-19 MED ORDER — FENTANYL CITRATE (PF) 250 MCG/5ML IJ SOLN
INTRAMUSCULAR | Status: AC
  Filled 2021-01-19: qty 5

## 2021-01-19 MED ORDER — LACTATED RINGERS IV SOLN: INTRAVENOUS | Status: DC

## 2021-01-19 SURGICAL SUPPLY — 36 items
BAG COUNTER SPONGE SURGICOUNT (BAG) IMPLANT
BLADE SURG 21 STRL SS (BLADE) ×2 IMPLANT
BNDG COHESIVE 6X5 TAN STRL LF (GAUZE/BANDAGES/DRESSINGS) IMPLANT
BNDG GAUZE ELAST 4 BULKY (GAUZE/BANDAGES/DRESSINGS) ×4 IMPLANT
COVER SURGICAL LIGHT HANDLE (MISCELLANEOUS) ×4 IMPLANT
DRAPE U-SHAPE 47X51 STRL (DRAPES) ×2 IMPLANT
DRESSING VERAFLO CLEANS CC MED (GAUZE/BANDAGES/DRESSINGS) ×1 IMPLANT
DRSG ADAPTIC 3X8 NADH LF (GAUZE/BANDAGES/DRESSINGS) ×2 IMPLANT
DRSG VERAFLO CLEANSE CC MED (GAUZE/BANDAGES/DRESSINGS) ×2
DURAPREP 26ML APPLICATOR (WOUND CARE) ×2 IMPLANT
ELECT REM PT RETURN 9FT ADLT (ELECTROSURGICAL)
ELECTRODE REM PT RTRN 9FT ADLT (ELECTROSURGICAL) IMPLANT
GAUZE SPONGE 4X4 12PLY STRL (GAUZE/BANDAGES/DRESSINGS) ×2 IMPLANT
GLOVE SURG ORTHO LTX SZ9 (GLOVE) ×2 IMPLANT
GLOVE SURG UNDER POLY LF SZ9 (GLOVE) ×2 IMPLANT
GOWN STRL REUS W/ TWL XL LVL3 (GOWN DISPOSABLE) ×2 IMPLANT
GOWN STRL REUS W/TWL XL LVL3 (GOWN DISPOSABLE) ×2
GRAFT SKIN WND MICRO 38 (Tissue) ×2 IMPLANT
GRAFT SKIN WND OMEGA3 3X3.5 (Tissue) ×2 IMPLANT
HANDPIECE INTERPULSE COAX TIP (DISPOSABLE)
KIT BASIN OR (CUSTOM PROCEDURE TRAY) ×2 IMPLANT
KIT DRSG PREVENA PLUS 7DAY 125 (MISCELLANEOUS) ×2 IMPLANT
KIT TURNOVER KIT B (KITS) ×2 IMPLANT
MANIFOLD NEPTUNE II (INSTRUMENTS) ×2 IMPLANT
NS IRRIG 1000ML POUR BTL (IV SOLUTION) ×2 IMPLANT
PACK ORTHO EXTREMITY (CUSTOM PROCEDURE TRAY) ×2 IMPLANT
PAD ARMBOARD 7.5X6 YLW CONV (MISCELLANEOUS) ×4 IMPLANT
PAD NEG PRESSURE SENSATRAC (MISCELLANEOUS) ×2 IMPLANT
SET HNDPC FAN SPRY TIP SCT (DISPOSABLE) IMPLANT
STOCKINETTE IMPERVIOUS 9X36 MD (GAUZE/BANDAGES/DRESSINGS) IMPLANT
SUT ETHILON 2 0 PSLX (SUTURE) ×4 IMPLANT
SWAB COLLECTION DEVICE MRSA (MISCELLANEOUS) ×2 IMPLANT
SWAB CULTURE ESWAB REG 1ML (MISCELLANEOUS) IMPLANT
TOWEL GREEN STERILE (TOWEL DISPOSABLE) ×2 IMPLANT
TUBE CONNECTING 12X1/4 (SUCTIONS) ×2 IMPLANT
YANKAUER SUCT BULB TIP NO VENT (SUCTIONS) ×2 IMPLANT

## 2021-01-19 NOTE — Telephone Encounter (Signed)
I called pt to advise that this has already been done and  medication has been approved.

## 2021-01-19 NOTE — Transfer of Care (Signed)
Immediate Anesthesia Transfer of Care Note  Patient: Donnald Garre  Procedure(s) Performed: LEFT FOOT DEBRIDEMENT AND APPLY SKIN GRAFT (Left)  Patient Location: PACU  Anesthesia Type:General  Level of Consciousness: sedated  Airway & Oxygen Therapy: Patient Spontanous Breathing, Patient connected to face mask oxygen and oral airway  Post-op Assessment: Report given to RN and Post -op Vital signs reviewed and stable  Post vital signs: Reviewed and stable  Last Vitals:  Vitals Value Taken Time  BP 101/56 01/19/21 1128  Temp    Pulse 51 01/19/21 1128  Resp 13 01/19/21 1128  SpO2 99 % 01/19/21 1128  Vitals shown include unvalidated device data.  Last Pain:  Vitals:   01/19/21 0918  TempSrc:   PainSc: 7       Patients Stated Pain Goal: 2 (01/19/21 4076)  Complications: No notable events documented.

## 2021-01-19 NOTE — Anesthesia Postprocedure Evaluation (Signed)
Anesthesia Post Note  Patient: Kent Day  Procedure(s) Performed: LEFT FOOT DEBRIDEMENT AND APPLY SKIN GRAFT (Left)     Patient location during evaluation: PACU Anesthesia Type: General Level of consciousness: awake Pain management: pain level controlled Vital Signs Assessment: post-procedure vital signs reviewed and stable Respiratory status: spontaneous breathing Cardiovascular status: stable Postop Assessment: no apparent nausea or vomiting Anesthetic complications: no   No notable events documented.  Last Vitals:  Vitals:   01/19/21 1145 01/19/21 1158  BP: (!) 104/53 108/80  Pulse: 70 67  Resp: 18 20  Temp:  37 C  SpO2: 100% 100%    Last Pain:  Vitals:   01/19/21 1158  TempSrc:   PainSc: 0-No pain                 Caren Macadam

## 2021-01-19 NOTE — Telephone Encounter (Signed)
Prior auth submitted through cover my meds for Oxycodone rx. [employer] has approved the rx and pt may fill at the pharmacy today.

## 2021-01-19 NOTE — Anesthesia Preprocedure Evaluation (Signed)
Anesthesia Evaluation  Patient identified by MRN, date of birth, ID band Patient awake    Reviewed: Allergy & Precautions, NPO status , Patient's Chart, lab work & pertinent test results  Airway Mallampati: II  TM Distance: >3 FB Neck ROM: Full    Dental no notable dental hx. (+) Teeth Intact   Pulmonary neg pulmonary ROS, Patient abstained from smoking.,    Pulmonary exam normal breath sounds clear to auscultation       Cardiovascular negative cardio ROS Normal cardiovascular exam Rhythm:Regular Rate:Normal     Neuro/Psych negative neurological ROS     GI/Hepatic negative GI ROS, Neg liver ROS,   Endo/Other  negative endocrine ROS  Renal/GU negative Renal ROS  negative genitourinary   Musculoskeletal   Abdominal (+) + obese,   Peds negative pediatric ROS (+)  Hematology negative hematology ROS (+)   Anesthesia Other Findings   Reproductive/Obstetrics negative OB ROS                             Anesthesia Physical  Anesthesia Plan  ASA: 2  Anesthesia Plan: General   Post-op Pain Management:    Induction: Intravenous  PONV Risk Score and Plan: 2  Airway Management Planned: LMA  Additional Equipment: None  Intra-op Plan:   Post-operative Plan: Extubation in OR  Informed Consent: I have reviewed the patients History and Physical, chart, labs and discussed the procedure including the risks, benefits and alternatives for the proposed anesthesia with the patient or authorized representative who has indicated his/her understanding and acceptance.     Dental advisory given  Plan Discussed with: CRNA  Anesthesia Plan Comments:         Anesthesia Quick Evaluation

## 2021-01-19 NOTE — H&P (Signed)
Kent Day is an 35 y.o. male.   Chief Complaint: Open wound dorsum left foot. HPI: Patient is a 35 year old gentleman who is status post crush injury to the left foot.  Patient underwent open reduction internal fixation and subsequently had tissue damage secondary to the crush injury.  Patient has undergone prolonged conservative therapy and has progressed to the point where there is no further wound healing with a open wound over the dorsum of the foot and patient presents at this time for debridement and skin graft.  Past Medical History:  Diagnosis Date   Anxiety    Arthritis     Past Surgical History:  Procedure Laterality Date   APPLICATION OF WOUND VAC Left 10/27/2020   Procedure: APPLICATION OF WOUND VAC;  Surgeon: Nadara Mustard, MD;  Location: [facility] OR;  Service: Orthopedics;  Laterality: Left;   COSMETIC SURGERY     Face   LUMBAR DISC SURGERY     OPEN REDUCTION INTERNAL FIXATION (ORIF) METACARPAL Left 10/27/2020   Procedure: OPEN REDUCTION INTERNAL FIXATION (ORIF) FOOT;  Surgeon: Nadara Mustard, MD;  Location: [facility] OR;  Service: Orthopedics;  Laterality: Left;    History reviewed. No pertinent family history. Social History:  reports that he has never smoked. His smokeless tobacco use includes chew. He reports current alcohol use. He reports that he does not currently use drugs after having used the following drugs: Marijuana.  Allergies: No Known Allergies  No medications prior to admission.    No results found for this or any previous visit (from the past 48 hour(s)). No results found.  Review of Systems  All other systems reviewed and are negative.  There were no vitals taken for this visit. Physical Exam  Patient is alert, oriented, no adenopathy, well-dressed, normal affect, normal respiratory effort. Examinations patient's foot is stable there is excellent epithelization he has made great progress with therapy now has dorsiflexion of the ankle to neutral with  his knee extended.   The wound is 3 x 3 cm and 1 cm deep.  The wound healing has stalled there has been no further progress and epithelization.   Assessment/Plan Assessment: Open wound dorsum left foot status post crush injury.  Plan: We will plan for debridement of the wound and placement of skin graft and powder.  Risk and benefits were discussed including the need for additional surgery.  Patient states he understands wished to proceed at this time plan for discharge after surgery.  Nadara Mustard, MD 01/19/2021, 6:44 AM

## 2021-01-19 NOTE — Op Note (Addendum)
01/19/2021  11:16 AM  PATIENT:  Kent Day    PRE-OPERATIVE DIAGNOSIS:  Wound Left Foot  POST-OPERATIVE DIAGNOSIS:  Same  PROCEDURE:  LEFT FOOT DEBRIDEMENT AND APPLY SKIN GRAFT  SURGEON:  Nadara Mustard, MD  PHYSICIAN ASSISTANT:None ANESTHESIA:   General  PREOPERATIVE INDICATIONS:  [name] is a  35 y.o. male with a diagnosis of Wound Left Foot who failed conservative measures and elected for surgical management.    The risks benefits and alternatives were discussed with the patient preoperatively including but not limited to the risks of infection, bleeding, nerve injury, cardiopulmonary complications, the need for revision surgery, among others, and the patient was willing to proceed.  OPERATIVE IMPLANTS: Kerecis powder 37 mg and Kerecis shaped 3 x 3 cm  @ENCIMAGES @  OPERATIVE FINDINGS: Good healthy granulation tissue no deep abscess good petechial bleeding  OPERATIVE PROCEDURE: Patient was brought the operating room and underwent a general anesthetic.  After adequate levels anesthesia were obtained patient's left lower extremity was prepped using DuraPrep draped into a sterile field a timeout was called.  A 21 blade knife was used to debride the skin and soft tissue with fibrinous tissue back to healthy viable bleeding granulation tissue.  This left a wound that was 3 x 3 cm.  This was cleansed the Kerecis powder was applied plus the Kerecis graft this was secured in place with staples a Praveena cleanse choice sponge was applied this was overwrapped with Coban this had a good suction fit patient was extubated taken to PACU in stable condition.  Debridement type: Excisional Debridement  Side: left  Body Location: foot   Tools used for debridement: scalpel  Pre-debridement Wound size (cm):   Length: 2        Width: 2     Depth: 1   Post-debridement Wound size (cm):   Length: 3        Width: 3     Depth: 1   Debridement depth beyond dead/damaged tissue down to  healthy viable tissue: yes  Tissue layer involved: skin, subcutaneous tissue  Nature of tissue removed: Non-viable tissue  Irrigation volume: 1 liter     Irrigation fluid type: Normal Saline    DISCHARGE PLANNING:  Antibiotic duration: Preoperative antibiotics only  Weightbearing: Touchdown weightbearing on the left  Pain medication: Prescription called in for Percocet  Dressing care/ Wound VAC: Continue wound VAC for 1 week  Ambulatory devices: Crutches  Discharge to: Home.  Follow-up: In the office 1 week post operative.

## 2021-01-19 NOTE — Anesthesia Procedure Notes (Signed)
Procedure Name: LMA Insertion Date/Time: 01/19/2021 10:56 AM Performed by: Jodell Cipro, CRNA Pre-anesthesia Checklist: Patient identified, Emergency Drugs available, Suction available and Patient being monitored Patient Re-evaluated:Patient Re-evaluated prior to induction Oxygen Delivery Method: Circle System Utilized Preoxygenation: Pre-oxygenation with 100% oxygen Induction Type: IV induction Ventilation: Mask ventilation without difficulty LMA: LMA inserted LMA Size: 5.0 Number of attempts: 1 Airway Equipment and Method: Bite block Placement Confirmation: positive ETCO2 Tube secured with: Tape Dental Injury: Teeth and Oropharynx as per pre-operative assessment

## 2021-01-19 NOTE — Telephone Encounter (Signed)
Pt called and needs prior auth for medication.   CB [phone]

## 2021-01-22 ENCOUNTER — Encounter (HOSPITAL_COMMUNITY): Payer: Self-pay | Admitting: Orthopedic Surgery

## 2021-01-25 ENCOUNTER — Ambulatory Visit (INDEPENDENT_AMBULATORY_CARE_PROVIDER_SITE_OTHER): Payer: Self-pay | Admitting: Orthopedic Surgery

## 2021-01-25 ENCOUNTER — Other Ambulatory Visit: Payer: Self-pay

## 2021-01-25 DIAGNOSIS — S9782XD Crushing injury of left foot, subsequent encounter: Secondary | ICD-10-CM

## 2021-02-06 ENCOUNTER — Encounter: Payer: Self-pay | Admitting: Orthopedic Surgery

## 2021-02-06 NOTE — Progress Notes (Signed)
Office Visit Note   Patient: Kent Day           Date of Birth: Aug 01, 1985           MRN: [id number] Visit Date: 01/25/2021              Requested by: No referring provider defined for this encounter. PCP: Pcp, No  Chief Complaint  Patient presents with   Left Foot - Routine Post Op    01/19/21 Left foot deb and kerecis graft Wound vac taken off and photo taken      HPI: Patient is a 35 year old gentleman who is seen 1 week status post left foot debridement and placement of Kerecis graft.  The wound VAC is in place.  Assessment & Plan: Visit Diagnoses:  1. Crushing injury of left foot, subsequent encounter     Plan: Patient will advance to wearing the axial compression socks 24 hours a day.  Weightbearing as tolerated on the left.  Note for seated work return to physical therapy.  Obtain radiographs at follow-up.  Follow-Up Instructions: Return in about 4 weeks (around 02/22/2021).   Ortho Exam  Patient is alert, oriented, no adenopathy, well-dressed, normal affect, normal respiratory effort. Examination the wound VAC is removed and the staples are removed.  The foot wound has excellent granulation tissue and good incorporation of the Kerecis graft.  Imaging: No results found.   Labs: No results found for: HGBA1C, ESRSEDRATE, CRP, LABURIC, REPTSTATUS, GRAMSTAIN, CULT, LABORGA   No results found for: ALBUMIN, PREALBUMIN, CBC  No results found for: MG No results found for: VD25OH  No results found for: PREALBUMIN CBC EXTENDED Latest Ref Rng & Units 01/19/2021 10/26/2020  WBC 4.0 - 10.5 K/uL 5.4 13.4(H)  RBC 4.22 - 5.81 MIL/uL 5.34 4.60  HGB 13.0 - 17.0 g/dL 12.4 58.0  HCT 99.8 - 33.8 % 47.9 42.2  PLT 150 - 400 K/uL 277 281  NEUTROABS 1.7 - 7.7 K/uL - 11.4(H)  LYMPHSABS 0.7 - 4.0 K/uL - 1.0     There is no height or weight on file to calculate BMI.  Orders:  No orders of the defined types were placed in this encounter.  No orders of the defined  types were placed in this encounter.    Procedures: No procedures performed  Clinical Data: No additional findings.  ROS:  All other systems negative, except as noted in the HPI. Review of Systems  Objective: Vital Signs: There were no vitals taken for this visit.  Specialty Comments:  No specialty comments available.  PMFS History: Patient Active Problem List   Diagnosis Date Noted   Lisfranc's dislocation 10/28/2020   Lisfranc dislocation, left, initial encounter 10/27/2020   Crushing injury of left foot    Past Medical History:  Diagnosis Date   Anxiety    Arthritis     History reviewed. No pertinent family history.  Past Surgical History:  Procedure Laterality Date   APPLICATION OF WOUND VAC Left 10/27/2020   Procedure: APPLICATION OF WOUND VAC;  Surgeon: Nadara Mustard, MD;  Location: [facility] OR;  Service: Orthopedics;  Laterality: Left;   COSMETIC SURGERY     Face   I & D EXTREMITY Left 01/19/2021   Procedure: LEFT FOOT DEBRIDEMENT AND APPLY SKIN GRAFT;  Surgeon: Nadara Mustard, MD;  Location: [facility] OR;  Service: Orthopedics;  Laterality: Left;   LUMBAR DISC SURGERY     OPEN REDUCTION INTERNAL FIXATION (ORIF) METACARPAL Left 10/27/2020   Procedure: OPEN REDUCTION INTERNAL  FIXATION (ORIF) FOOT;  Surgeon: Nadara Mustard, MD;  Location: [facility] OR;  Service: Orthopedics;  Laterality: Left;   Social History   Occupational History   Not on file  Tobacco Use   Smoking status: Never   Smokeless tobacco: Current    Types: Chew  Vaping Use   Vaping Use: Never used  Substance and Sexual Activity   Alcohol use: Yes    Comment: occasional   Drug use: Not Currently    Types: Marijuana    Comment: occasional   Sexual activity: Not on file

## 2021-02-08 ENCOUNTER — Other Ambulatory Visit: Payer: Self-pay

## 2021-02-08 ENCOUNTER — Ambulatory Visit (INDEPENDENT_AMBULATORY_CARE_PROVIDER_SITE_OTHER): Payer: Worker's Compensation | Admitting: Orthopedic Surgery

## 2021-02-08 DIAGNOSIS — S9782XD Crushing injury of left foot, subsequent encounter: Secondary | ICD-10-CM | POA: Diagnosis not present

## 2021-03-13 ENCOUNTER — Ambulatory Visit (INDEPENDENT_AMBULATORY_CARE_PROVIDER_SITE_OTHER): Payer: Worker's Compensation | Admitting: Orthopedic Surgery

## 2021-03-13 ENCOUNTER — Encounter: Payer: Self-pay | Admitting: Orthopedic Surgery

## 2021-03-13 DIAGNOSIS — S9782XD Crushing injury of left foot, subsequent encounter: Secondary | ICD-10-CM | POA: Diagnosis not present

## 2021-03-15 ENCOUNTER — Encounter: Payer: Self-pay | Admitting: Orthopedic Surgery

## 2021-03-15 NOTE — Progress Notes (Signed)
Office Visit Note   Patient: Kent Day           Date of Birth: January 17, 1986           MRN: [id number] Visit Date: 03/13/2021              Requested by: No referring provider defined for this encounter. PCP: Pcp, No  Chief Complaint  Patient presents with   Left Foot - Routine Post Op    Left foot deb and kerecis graft 01/19/21      HPI: Patient is a 35 year old gentleman who presents in follow-up status post crush injury to his left foot.  He is currently wearing the Vive sock 24 hours a day weightbearing as tolerated he has been back to seated sedentary work.  Assessment & Plan: Visit Diagnoses:  1. Crushing injury of left foot, subsequent encounter     Plan: We will allow patient to increase his activities at work advance to standing and floor work 3 hours a day with breaks as needed for the next 2 weeks and then advance to 5 hours a day for work for the next 2 weeks and reevaluate in 4 weeks.  Patient will complete his physical therapy.  Follow-Up Instructions: Return in about 4 weeks (around 04/10/2021).   Ortho Exam  Patient is alert, oriented, no adenopathy, well-dressed, normal affect, normal respiratory effort. Examination patient has progressive healing of the scab on the dorsum of the left foot.  There is improved epithelialization.  There is no cellulitis no drainage no signs of infection.  Patient continues to show excellent healing there is still some swelling.  Imaging: No results found.   Labs: No results found for: HGBA1C, ESRSEDRATE, CRP, LABURIC, REPTSTATUS, GRAMSTAIN, CULT, LABORGA   No results found for: ALBUMIN, PREALBUMIN, CBC  No results found for: MG No results found for: VD25OH  No results found for: PREALBUMIN CBC EXTENDED Latest Ref Rng & Units 01/19/2021 10/26/2020  WBC 4.0 - 10.5 K/uL 5.4 13.4(H)  RBC 4.22 - 5.81 MIL/uL 5.34 4.60  HGB 13.0 - 17.0 g/dL 33.3 83.2  HCT 91.9 - 16.6 % 47.9 42.2  PLT 150 - 400 K/uL 277 281  NEUTROABS  1.7 - 7.7 K/uL - 11.4(H)  LYMPHSABS 0.7 - 4.0 K/uL - 1.0     There is no height or weight on file to calculate BMI.  Orders:  No orders of the defined types were placed in this encounter.  No orders of the defined types were placed in this encounter.    Procedures: No procedures performed  Clinical Data: No additional findings.  ROS:  All other systems negative, except as noted in the HPI. Review of Systems  Objective: Vital Signs: There were no vitals taken for this visit.  Specialty Comments:  No specialty comments available.  PMFS History: Patient Active Problem List   Diagnosis Date Noted   Lisfranc's dislocation 10/28/2020   Lisfranc dislocation, left, initial encounter 10/27/2020   Crushing injury of left foot    Past Medical History:  Diagnosis Date   Anxiety    Arthritis     History reviewed. No pertinent family history.  Past Surgical History:  Procedure Laterality Date   APPLICATION OF WOUND VAC Left 10/27/2020   Procedure: APPLICATION OF WOUND VAC;  Surgeon: Nadara Mustard, MD;  Location: [facility] OR;  Service: Orthopedics;  Laterality: Left;   COSMETIC SURGERY     Face   I & D EXTREMITY Left 01/19/2021   Procedure: LEFT  FOOT DEBRIDEMENT AND APPLY SKIN GRAFT;  Surgeon: Nadara Mustard, MD;  Location: [facility] OR;  Service: Orthopedics;  Laterality: Left;   LUMBAR DISC SURGERY     OPEN REDUCTION INTERNAL FIXATION (ORIF) METACARPAL Left 10/27/2020   Procedure: OPEN REDUCTION INTERNAL FIXATION (ORIF) FOOT;  Surgeon: Nadara Mustard, MD;  Location: [facility] OR;  Service: Orthopedics;  Laterality: Left;   Social History   Occupational History   Not on file  Tobacco Use   Smoking status: Never   Smokeless tobacco: Current    Types: Chew  Vaping Use   Vaping Use: Never used  Substance and Sexual Activity   Alcohol use: Yes    Comment: occasional   Drug use: Not Currently    Types: Marijuana    Comment: occasional   Sexual activity: Not on file

## 2021-03-21 ENCOUNTER — Encounter: Payer: Self-pay | Admitting: Orthopedic Surgery

## 2021-03-21 NOTE — Progress Notes (Signed)
Office Visit Note   Patient: Kent Day           Date of Birth: [birth year]           MRN: [id number] Visit Date: 02/08/2021              Requested by: No referring provider defined for this encounter. PCP: Pcp, No  Chief Complaint  Patient presents with   Left Foot - Routine Post Op    Debridement and kerecis graft 01/19/21      HPI: Patient is a 35 year old gentleman who presents status post biologic Kerecis skin graft to the left foot approximately 3 weeks ago.  Patient is currently undergoing physical therapy which she states will transition to home exercise.  He is currently on seated work.  Assessment & Plan: Visit Diagnoses:  1. Crushing injury of left foot, subsequent encounter     Plan: Continue seated work for 4 weeks reevaluate in 4 weeks.  Follow-Up Instructions: Return in about 4 weeks (around 03/08/2021).   Ortho Exam  Patient is alert, oriented, no adenopathy, well-dressed, normal affect, normal respiratory effort. Examination the dorsal foot wound has good granulation tissue there is no cellulitis no odor no drainage.  The wound is currently 2 x 3 cm with healthy granulation tissue.  Patient has stiffness in his toes and he was given instructions and recommended fascial strengthening.  Imaging: No results found.   Labs: No results found for: HGBA1C, ESRSEDRATE, CRP, LABURIC, REPTSTATUS, GRAMSTAIN, CULT, LABORGA   No results found for: ALBUMIN, PREALBUMIN, CBC  No results found for: MG No results found for: VD25OH  No results found for: PREALBUMIN CBC EXTENDED Latest Ref Rng & Units 01/19/2021 10/26/2020  WBC 4.0 - 10.5 K/uL 5.4 13.4(H)  RBC 4.22 - 5.81 MIL/uL 5.34 4.60  HGB 13.0 - 17.0 g/dL 10.3 15.9  HCT 45.8 - 59.2 % 47.9 42.2  PLT 150 - 400 K/uL 277 281  NEUTROABS 1.7 - 7.7 K/uL - 11.4(H)  LYMPHSABS 0.7 - 4.0 K/uL - 1.0     There is no height or weight on file to calculate BMI.  Orders:  No orders of the defined types were placed  in this encounter.  No orders of the defined types were placed in this encounter.    Procedures: No procedures performed  Clinical Data: No additional findings.  ROS:  All other systems negative, except as noted in the HPI. Review of Systems  Objective: Vital Signs: There were no vitals taken for this visit.  Specialty Comments:  No specialty comments available.  PMFS History: Patient Active Problem List   Diagnosis Date Noted   Lisfranc's dislocation 10/28/2020   Lisfranc dislocation, left, initial encounter 10/27/2020   Crushing injury of left foot    Past Medical History:  Diagnosis Date   Anxiety    Arthritis     History reviewed. No pertinent family history.  Past Surgical History:  Procedure Laterality Date   APPLICATION OF WOUND VAC Left 10/27/2020   Procedure: APPLICATION OF WOUND VAC;  Surgeon: Nadara Mustard, MD;  Location: [facility] OR;  Service: Orthopedics;  Laterality: Left;   COSMETIC SURGERY     Face   I & D EXTREMITY Left 01/19/2021   Procedure: LEFT FOOT DEBRIDEMENT AND APPLY SKIN GRAFT;  Surgeon: Nadara Mustard, MD;  Location: [facility] OR;  Service: Orthopedics;  Laterality: Left;   LUMBAR DISC SURGERY     OPEN REDUCTION INTERNAL FIXATION (ORIF) METACARPAL Left 10/27/2020   Procedure:  OPEN REDUCTION INTERNAL FIXATION (ORIF) FOOT;  Surgeon: Nadara Mustard, MD;  Location: [facility] OR;  Service: Orthopedics;  Laterality: Left;   Social History   Occupational History   Not on file  Tobacco Use   Smoking status: Never   Smokeless tobacco: Current    Types: Chew  Vaping Use   Vaping Use: Never used  Substance and Sexual Activity   Alcohol use: Yes    Comment: occasional   Drug use: Not Currently    Types: Marijuana    Comment: occasional   Sexual activity: Not on file

## 2021-04-10 ENCOUNTER — Other Ambulatory Visit: Payer: Self-pay

## 2021-04-10 ENCOUNTER — Ambulatory Visit (INDEPENDENT_AMBULATORY_CARE_PROVIDER_SITE_OTHER): Payer: Worker's Compensation | Admitting: Orthopedic Surgery

## 2021-04-10 DIAGNOSIS — S93324D Dislocation of tarsometatarsal joint of right foot, subsequent encounter: Secondary | ICD-10-CM

## 2021-04-10 DIAGNOSIS — S9782XD Crushing injury of left foot, subsequent encounter: Secondary | ICD-10-CM

## 2021-05-06 ENCOUNTER — Encounter: Payer: Self-pay | Admitting: Orthopedic Surgery

## 2021-05-06 NOTE — Progress Notes (Signed)
Office Visit Note   Patient: Kent Day           Date of Birth: Aug 26, 1985           MRN: [id number] Visit Date: 04/10/2021              Requested by: No referring provider defined for this encounter. PCP: Pcp, No  Chief Complaint  Patient presents with   Left Foot - Routine Post Op    01/19/21 left foot debridement and Kerecis graft and powder       HPI: Patient is approximately 3 months status post left foot debridement and use of Kerecis powder and biologic tissue graft.  Patient is currently wearing a Vive compression stocking.  Assessment & Plan: Visit Diagnoses:  1. Crushing injury of left foot, subsequent encounter   2. Dislocation of tarsometatarsal joint of right foot, subsequent encounter     Plan: Patient will continue increase his activities as tolerated at work.  He was previous on 3 hours of standing now he is on 5 to 7 hours of standing continue 5 to 7 hours of standing on the floor for 2 weeks then full work without restrictions.  Follow-Up Instructions: Return in about 4 weeks (around 05/08/2021).   Ortho Exam  Patient is alert, oriented, no adenopathy, well-dressed, normal affect, normal respiratory effort. Examination patient has improved range of motion of his toes and ankles.  The wounds are completely healed he still has some swelling.   The midfoot internal fixation is stable.  There is decreased subtalar motion.  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  Imaging: No results found.   Labs: No results found for: HGBA1C, ESRSEDRATE, CRP, LABURIC, REPTSTATUS, GRAMSTAIN, CULT, LABORGA   No results found for: ALBUMIN, PREALBUMIN, CBC  No results found for: MG No results found for: VD25OH  No results found for: PREALBUMIN CBC EXTENDED Latest Ref Rng & Units 01/19/2021 10/26/2020  WBC 4.0 - 10.5 K/uL 5.4  13.4(H)  RBC 4.22 - 5.81 MIL/uL 5.34 4.60  HGB 13.0 - 17.0 g/dL 65.0 35.4  HCT 65.6 - 81.2 % 47.9 42.2  PLT 150 - 400 K/uL 277 281  NEUTROABS 1.7 - 7.7 K/uL - 11.4(H)  LYMPHSABS 0.7 - 4.0 K/uL - 1.0     There is no height or weight on file to calculate BMI.  Orders:  No orders of the defined types were placed in this encounter.  No orders of the defined types were placed in this encounter.    Procedures: No procedures performed  Clinical Data: No additional findings.  ROS:  All other systems negative, except as noted in the HPI. Review of Systems  Objective: Vital Signs: There were no vitals taken for this visit.  Specialty Comments:  No specialty comments available.  PMFS History: Patient Active Problem List   Diagnosis Date Noted   Lisfranc's dislocation 10/28/2020   Lisfranc dislocation, left, initial encounter 10/27/2020   Crushing injury of left foot    Past Medical History:  Diagnosis Date   Anxiety    Arthritis     History reviewed. No pertinent family history.  Past Surgical History:  Procedure Laterality Date   APPLICATION OF WOUND VAC Left 10/27/2020   Procedure: APPLICATION OF WOUND VAC;  Surgeon: Nadara Mustard, MD;  Location: [facility] OR;  Service: Orthopedics;  Laterality: Left;  COSMETIC SURGERY     Face   I & D EXTREMITY Left 01/19/2021   Procedure: LEFT FOOT DEBRIDEMENT AND APPLY SKIN GRAFT;  Surgeon: Newt Minion, MD;  Location: [place];  Service: Orthopedics;  Laterality: Left;   LUMBAR DISC SURGERY     OPEN REDUCTION INTERNAL FIXATION (ORIF) METACARPAL Left 10/27/2020   Procedure: OPEN REDUCTION INTERNAL FIXATION (ORIF) FOOT;  Surgeon: Newt Minion, MD;  Location: [place];  Service: Orthopedics;  Laterality: Left;   Social History   Occupational History   Not on file  Tobacco Use   Smoking status: Never   Smokeless tobacco: Current    Types: Chew  Vaping Use   Vaping Use: Never used  Substance and Sexual Activity   Alcohol use: Yes     Comment: occasional   Drug use: Not Currently    Types: Marijuana    Comment: occasional   Sexual activity: Not on file

## 2021-05-08 ENCOUNTER — Other Ambulatory Visit: Payer: Self-pay

## 2021-05-08 ENCOUNTER — Ambulatory Visit (INDEPENDENT_AMBULATORY_CARE_PROVIDER_SITE_OTHER): Payer: Worker's Compensation | Admitting: Orthopedic Surgery

## 2021-05-08 DIAGNOSIS — S9782XD Crushing injury of left foot, subsequent encounter: Secondary | ICD-10-CM

## 2021-05-13 ENCOUNTER — Encounter: Payer: Self-pay | Admitting: Orthopedic Surgery

## 2021-05-13 NOTE — Progress Notes (Signed)
Office Visit Note   Patient: Kent Day           Date of Birth: 1985-07-29           MRN: [id number] Visit Date: 05/08/2021              Requested by: No referring provider defined for this encounter. PCP: Pcp, No  Chief Complaint  Patient presents with   Left Foot - Follow-up    01/19/21 left foot debridement and Kerecis graft      HPI: Patient is a 36 year old gentleman who presents status post crush injury to the left foot status post debridement and application of Kerecis skin graft.  He is currently wearing compression stockings.  He is full weightbearing in regular shoes.  Assessment & Plan: Visit Diagnoses:  1. Crushing injury of left foot, subsequent encounter     Plan: Patient will continue with the compression socks.  Patient is released at this time without restrictions.  In review of the [place] vascular guidelines for permanent partial impairment.  In review of the [place]  guidelines for permanent partial impairment his impairment would be 25% of the left foot with good ankle range of motion and decreased subtalar motion.  Follow-Up Instructions: Return if symptoms worsen or fail to improve.   Ortho Exam  Patient is alert, oriented, no adenopathy, well-dressed, normal affect, normal respiratory effort. Examination the wound is well-healed.  Patient has good range of motion of the ankle he has decreased subtalar motion.  Imaging: No results found. No images are attached to the encounter.  Labs: No results found for: HGBA1C, ESRSEDRATE, CRP, LABURIC, REPTSTATUS, GRAMSTAIN, CULT, LABORGA   No results found for: ALBUMIN, PREALBUMIN, CBC  No results found for: MG No results found for: VD25OH  No results found for: PREALBUMIN CBC EXTENDED Latest Ref Rng & Units 01/19/2021 10/26/2020  WBC 4.0 - 10.5 K/uL 5.4 13.4(H)  RBC 4.22 - 5.81 MIL/uL 5.34 4.60  HGB 13.0 - 17.0 g/dL 03.5 59.7  HCT 41.6 - 38.4 % 47.9 42.2  PLT 150 - 400 K/uL 277  281  NEUTROABS 1.7 - 7.7 K/uL - 11.4(H)  LYMPHSABS 0.7 - 4.0 K/uL - 1.0     There is no height or weight on file to calculate BMI.  Orders:  No orders of the defined types were placed in this encounter.  No orders of the defined types were placed in this encounter.    Procedures: No procedures performed  Clinical Data: No additional findings.  ROS:  All other systems negative, except as noted in the HPI. Review of Systems  Objective: Vital Signs: There were no vitals taken for this visit.  Specialty Comments:  No specialty comments available.  PMFS History: Patient Active Problem List   Diagnosis Date Noted   Lisfranc's dislocation 10/28/2020   Lisfranc dislocation, left, initial encounter 10/27/2020   Crushing injury of left foot    Past Medical History:  Diagnosis Date   Anxiety    Arthritis     History reviewed. No pertinent family history.  Past Surgical History:  Procedure Laterality Date   APPLICATION OF WOUND VAC Left 10/27/2020   Procedure: APPLICATION OF WOUND VAC;  Surgeon: Nadara Mustard, MD;  Location: [facility] OR;  Service: Orthopedics;  Laterality: Left;   COSMETIC SURGERY     Face   I & D EXTREMITY Left 01/19/2021   Procedure: LEFT FOOT DEBRIDEMENT AND APPLY SKIN GRAFT;  Surgeon: Nadara Mustard, MD;  Location:  [facility] OR;  Service: Orthopedics;  Laterality: Left;   LUMBAR DISC SURGERY     OPEN REDUCTION INTERNAL FIXATION (ORIF) METACARPAL Left 10/27/2020   Procedure: OPEN REDUCTION INTERNAL FIXATION (ORIF) FOOT;  Surgeon: Nadara Mustard, MD;  Location: [facility] OR;  Service: Orthopedics;  Laterality: Left;   Social History   Occupational History   Not on file  Tobacco Use   Smoking status: Never   Smokeless tobacco: Current    Types: Chew  Vaping Use   Vaping Use: Never used  Substance and Sexual Activity   Alcohol use: Yes    Comment: occasional   Drug use: Not Currently    Types: Marijuana    Comment: occasional   Sexual activity: Not on file

## 2023-01-22 IMAGING — CR DG ANKLE COMPLETE 3+V*L*
3 series · 3 of 3 positions shown · non-contrast
Comparison: None.

CLINICAL DATA: Left foot injury at work.

EXAM:
LEFT ANKLE COMPLETE - 3+ VIEW

[ankle ap]
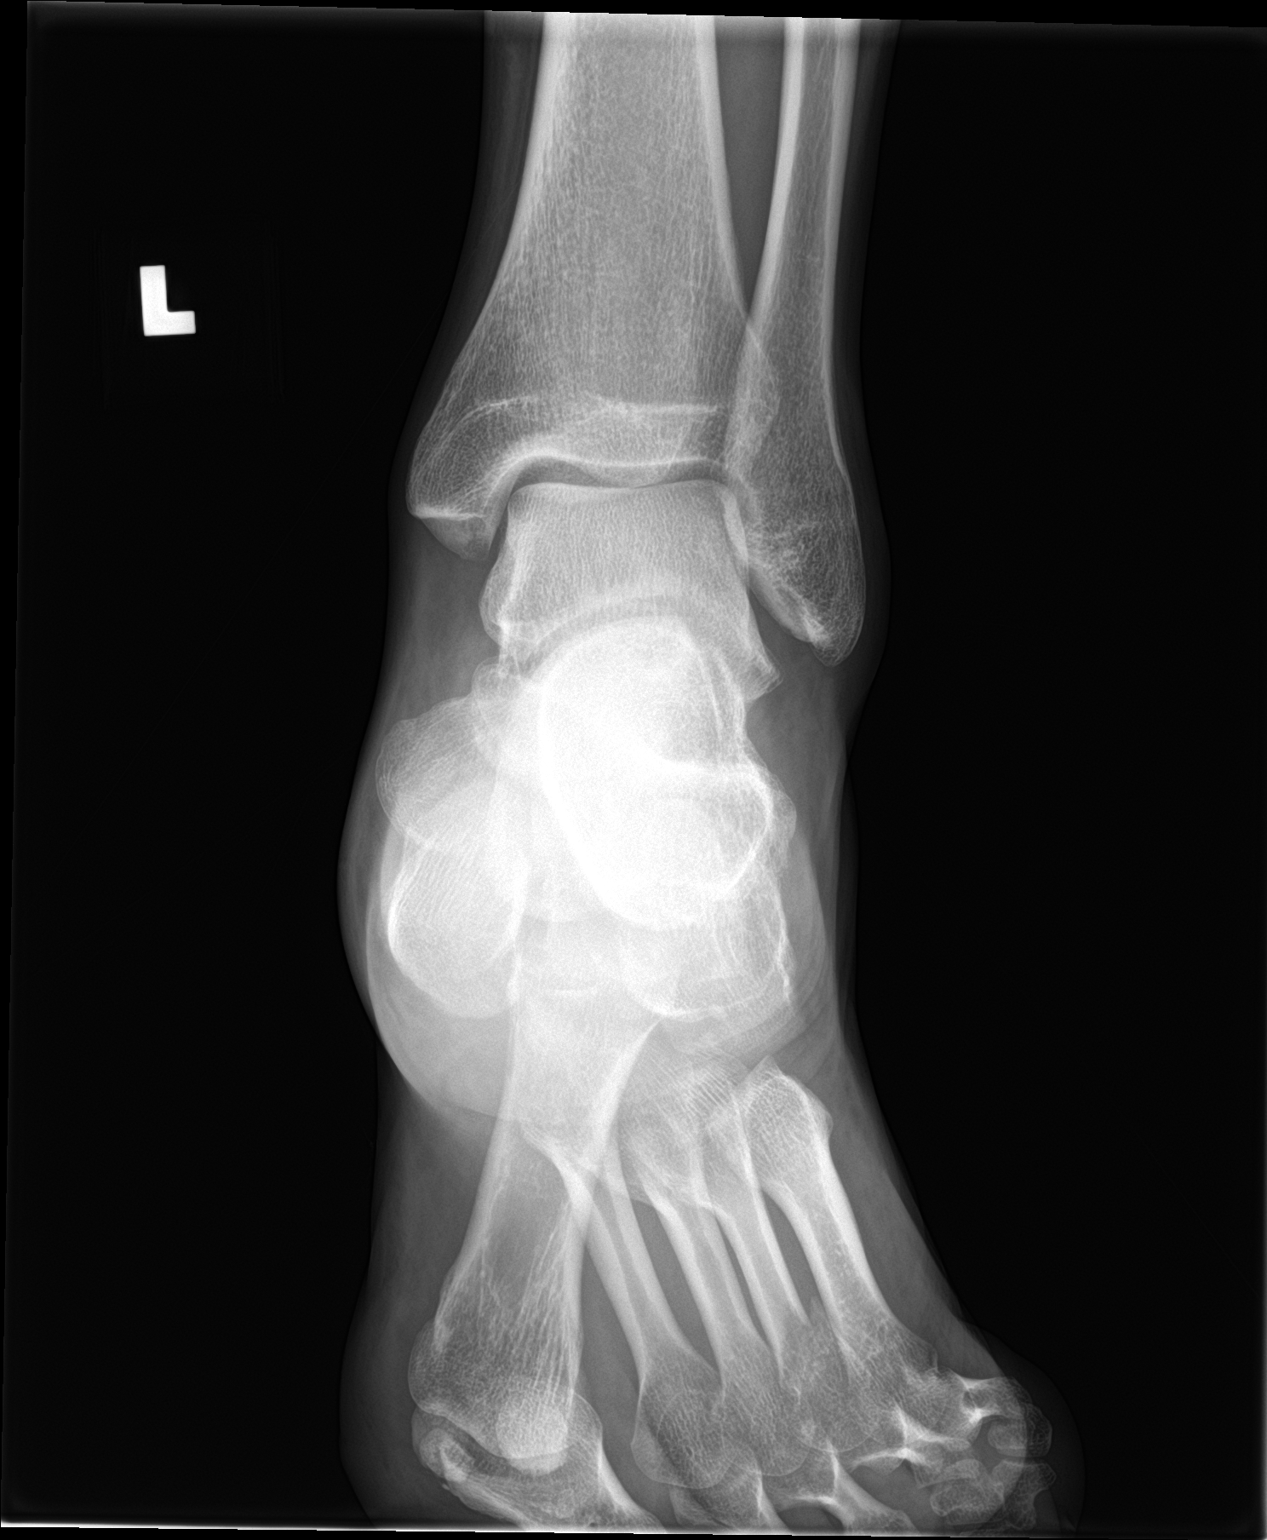

[ankle obl]
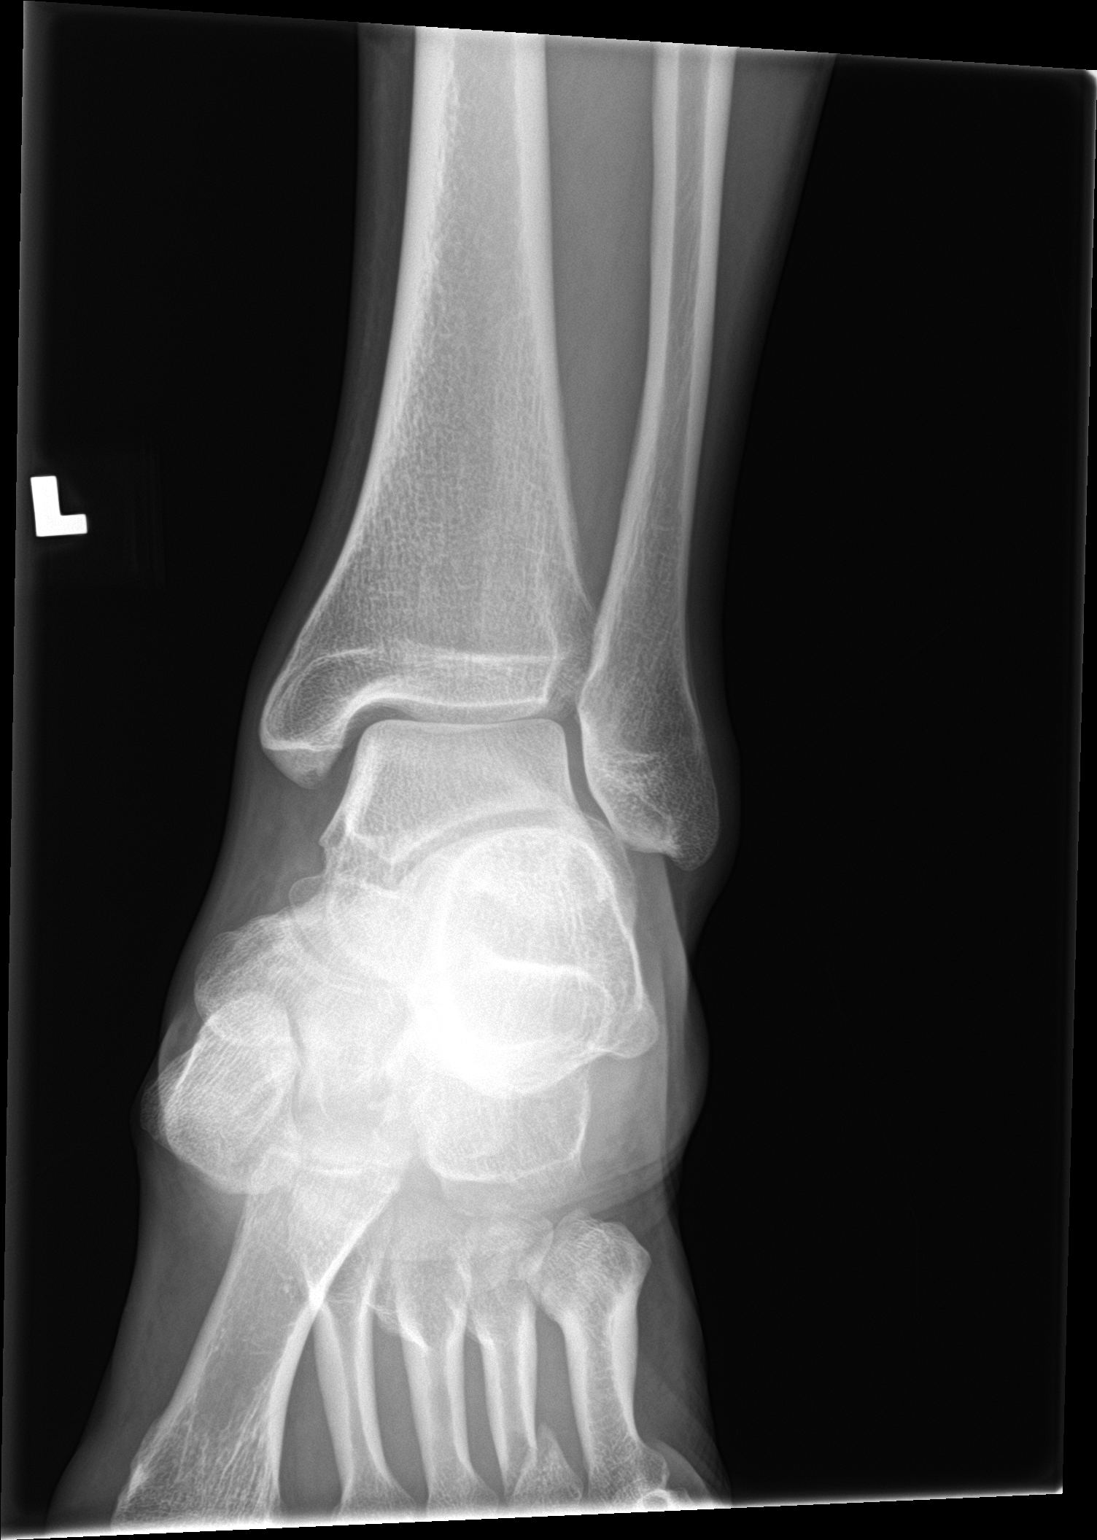

[ankle lat]
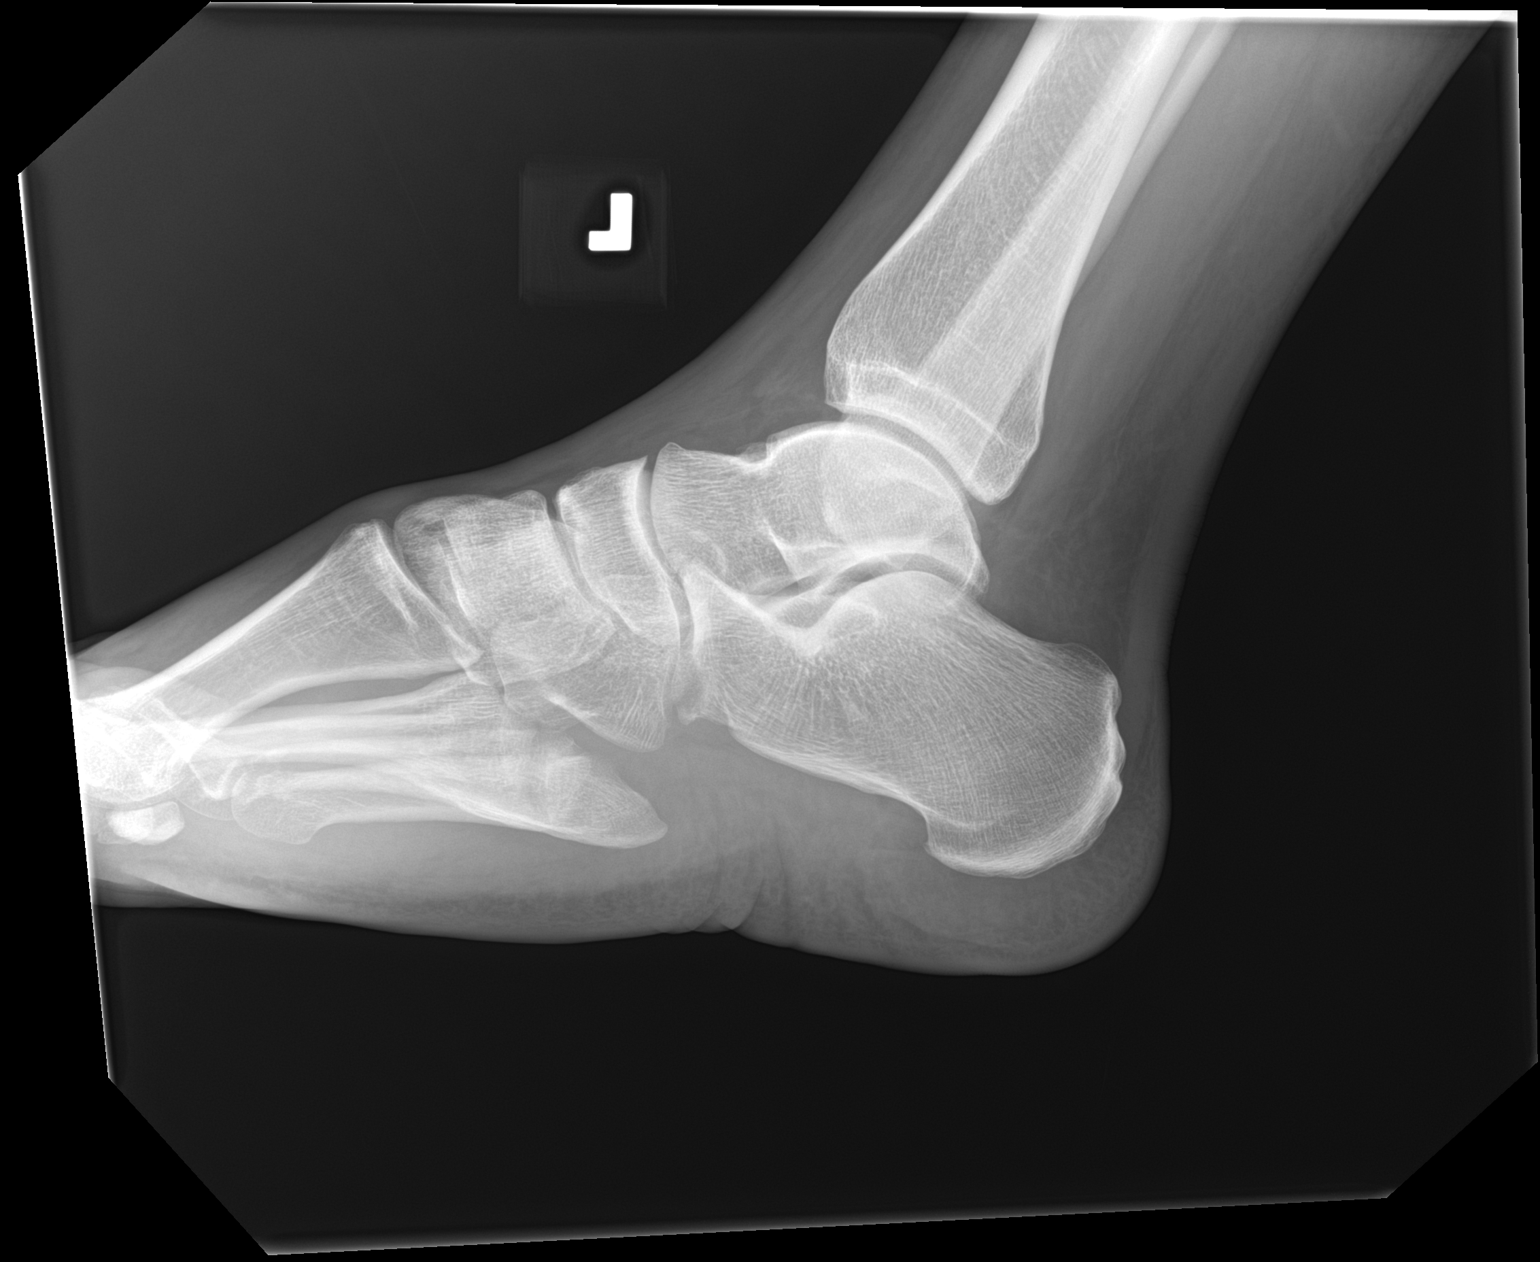

[3 of 3 positions shown; findings below may reference images not displayed]

FINDINGS: There is no evidence of fracture, dislocation, or joint effusion
involving the left ankle. There is no evidence of arthropathy or
other focal bone abnormality. Soft tissues are unremarkable.
IMPRESSION: No abnormality seen involving the left ankle. Fractures and
dislocations are seen involving the tarsometatarsal joints and
fourth metatarsal as described on foot radiographs of same day.

## 2023-01-22 IMAGING — CT CT FOOT*L* W/O CM
2 of 3 series · 13 of 30 positions shown, 15 images · non-contrast
Comparison: None.

CLINICAL DATA: Status post trauma.

EXAM:
CT OF THE LEFT FOOT WITHOUT CONTRAST
TECHNIQUE: Multidetector CT imaging of the left foot was performed according to
the standard protocol. Multiplanar CT image reconstructions were
also generated.

[Series 4: left foot 1.5 st · axial · 0.59mm/px · z∈[+73,+271]mm · 7 of 157 slices shown, 9 images]
[im 13/157  soft-tissue]
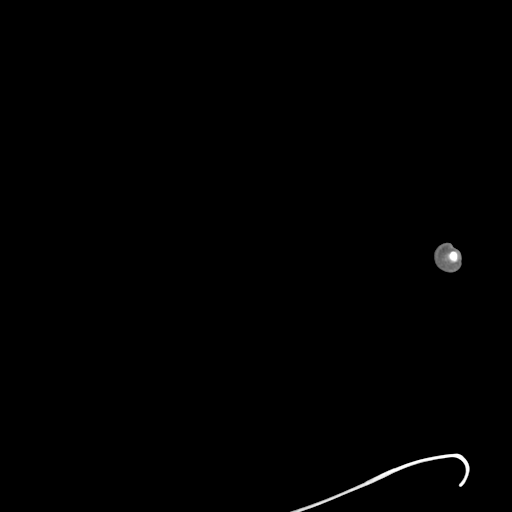
[im 13/157  bone]
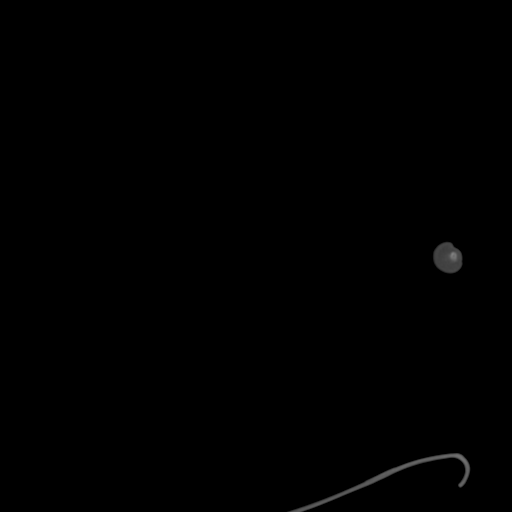
[im 37/157  bone]
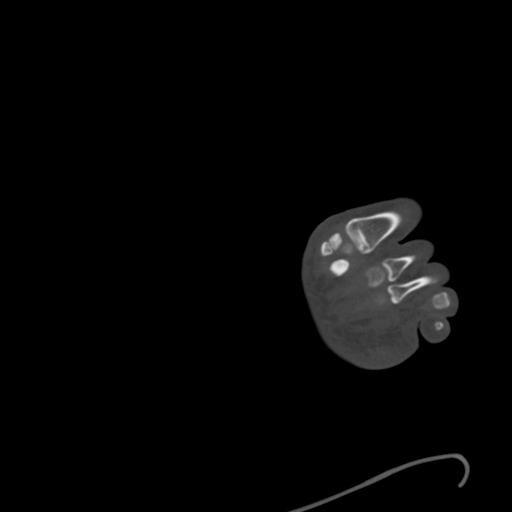
[im 61/157  bone]
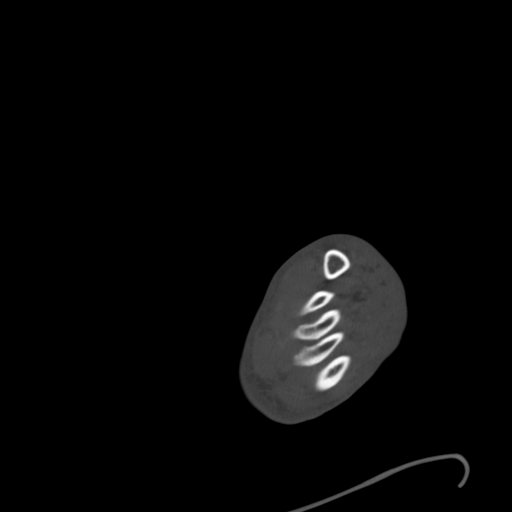
[im 85/157  bone]
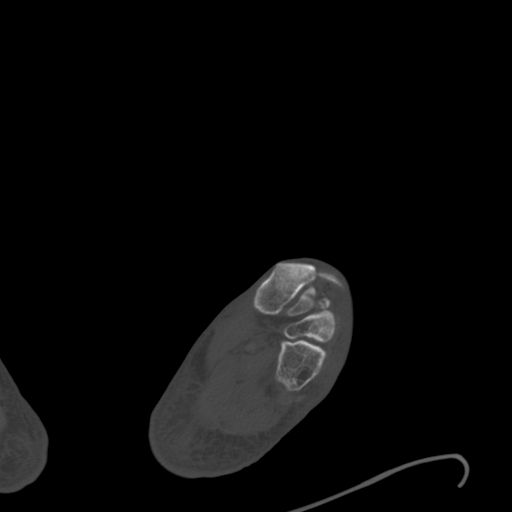
[im 97/157  soft-tissue]
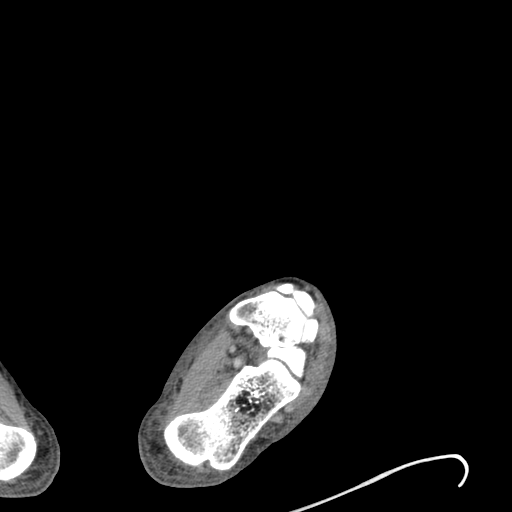
[im 97/157  bone]
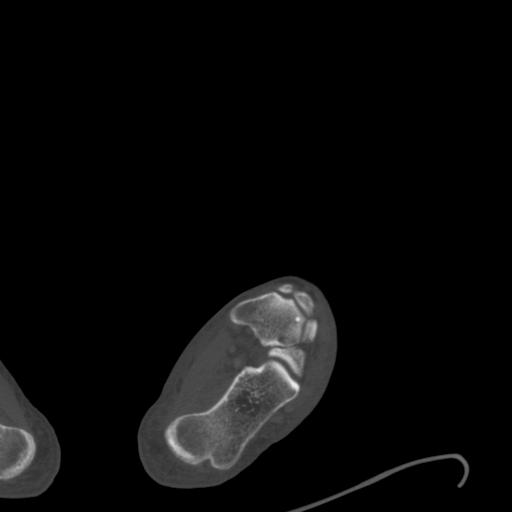
[im 121/157  bone]
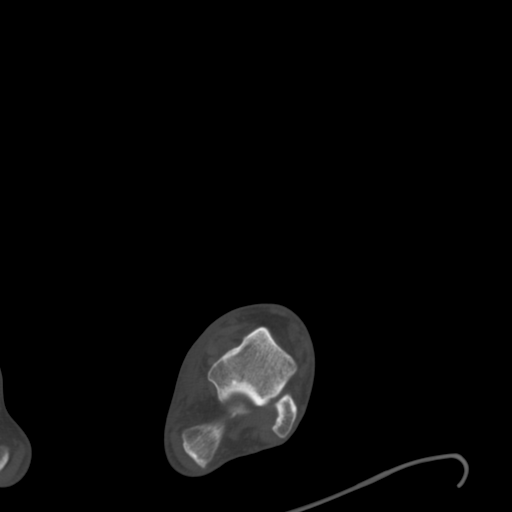
[im 145/157  bone]
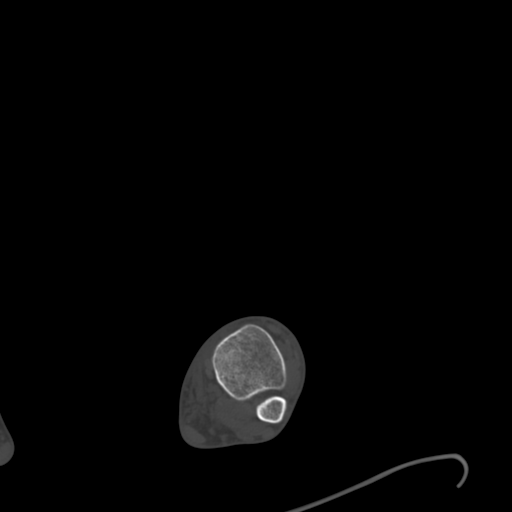

[Series 10: left foot cor st · sagittal · 0.26mm/px · 6 of 140 slices shown]
[im 24/140  bone]
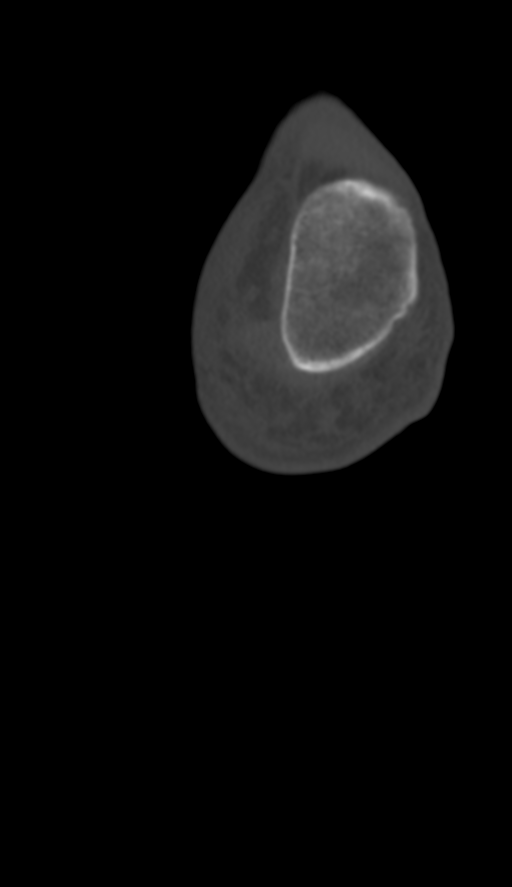
[im 27/140  soft-tissue]
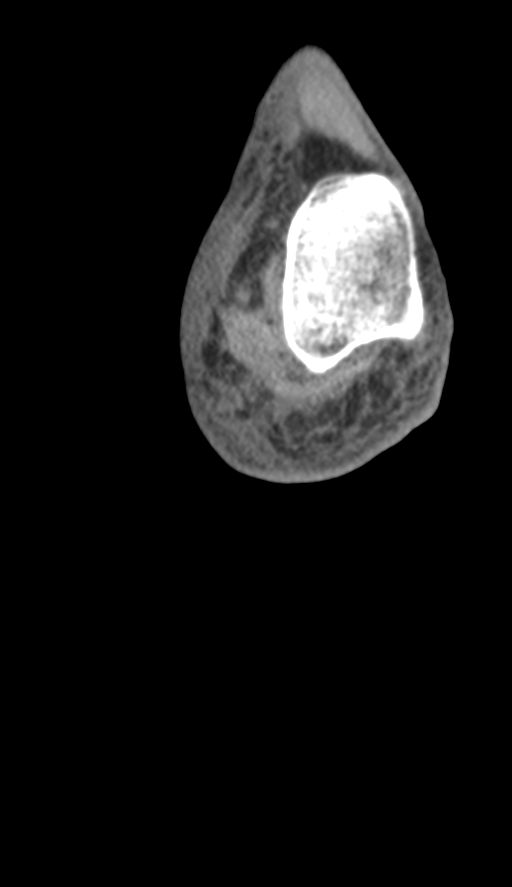
[im 47/140  bone]
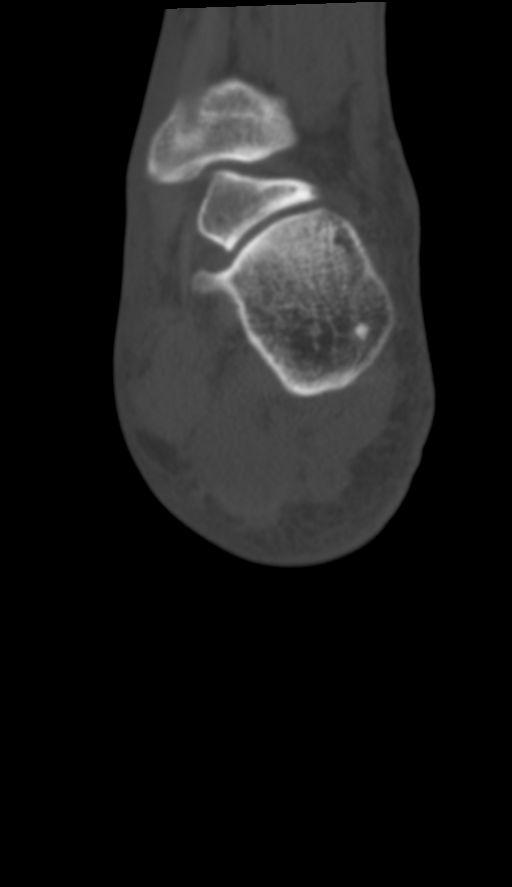
[im 70/140  bone]
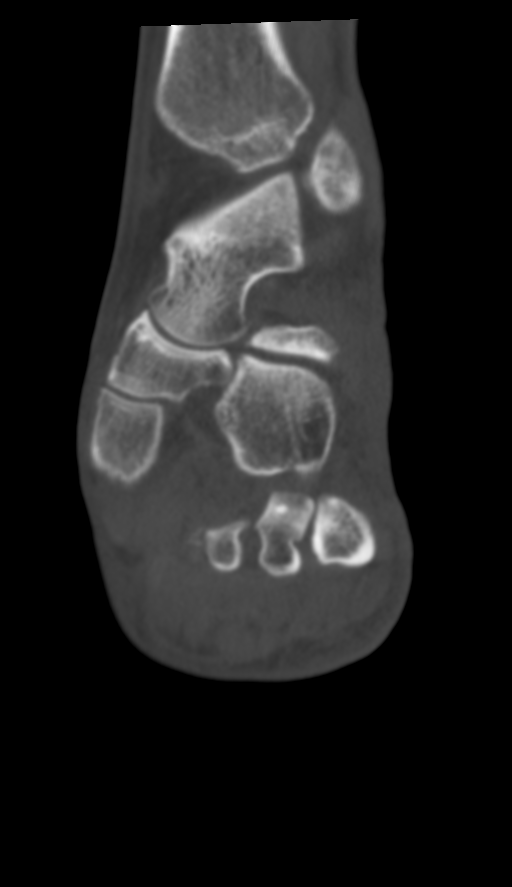
[im 93/140  bone]
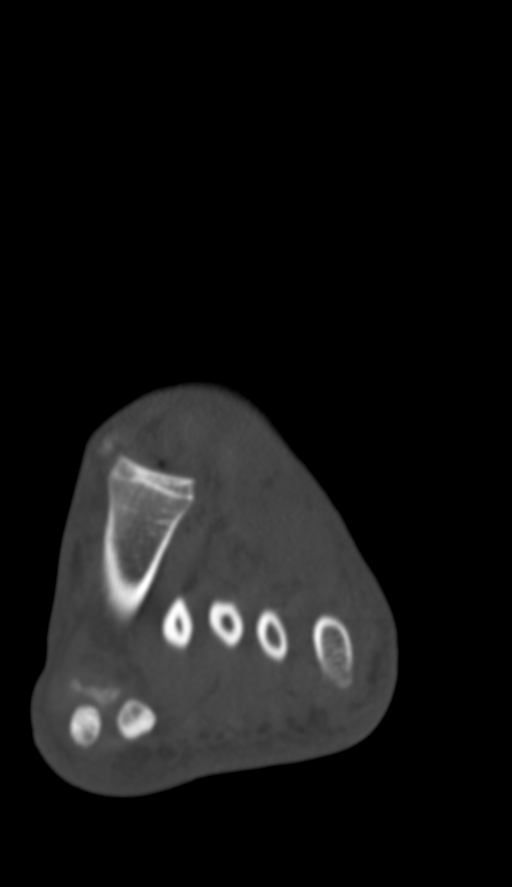
[im 116/140  bone]
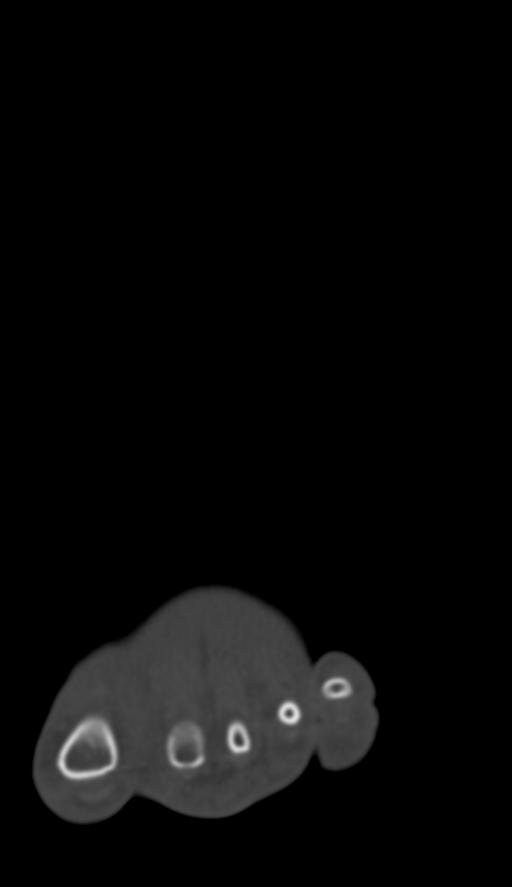

[13 of 30 positions shown; findings below may reference images not displayed]

FINDINGS: Bones/Joint/Cartilage

Acute fracture deformities are seen involving the distal aspect of
the fourth left metatarsal, base of the second left metatarsal and
plantar aspect of the left medial cuneiform bone.

Plantar dislocation of the first, third, fourth and fifth left
metatarsals is seen. While the second left metatarsal is not
dislocated, plantar displacement of the distal fracture site is
noted.

Ligaments

Suboptimally assessed by CT.

Muscles and Tendons

Limited in evaluation.

Soft tissues

Marked severity diffuse soft tissue swelling is seen throughout the
left foot with an extensive amount of soft tissue edema and
inflammatory fat stranding.
IMPRESSION: Lisfranc fracture/dislocation of the left foot, as described above,
with an additional fracture of the fourth left metatarsal. MRI
correlation is recommended.

## 2023-01-22 IMAGING — CR DG FOOT COMPLETE 3+V*L*
4 series · 4 of 4 positions shown · non-contrast
Comparison: None.

CLINICAL DATA: Left foot pain after injury at work.

EXAM:
LEFT FOOT - COMPLETE 3+ VIEW

[foot obl]
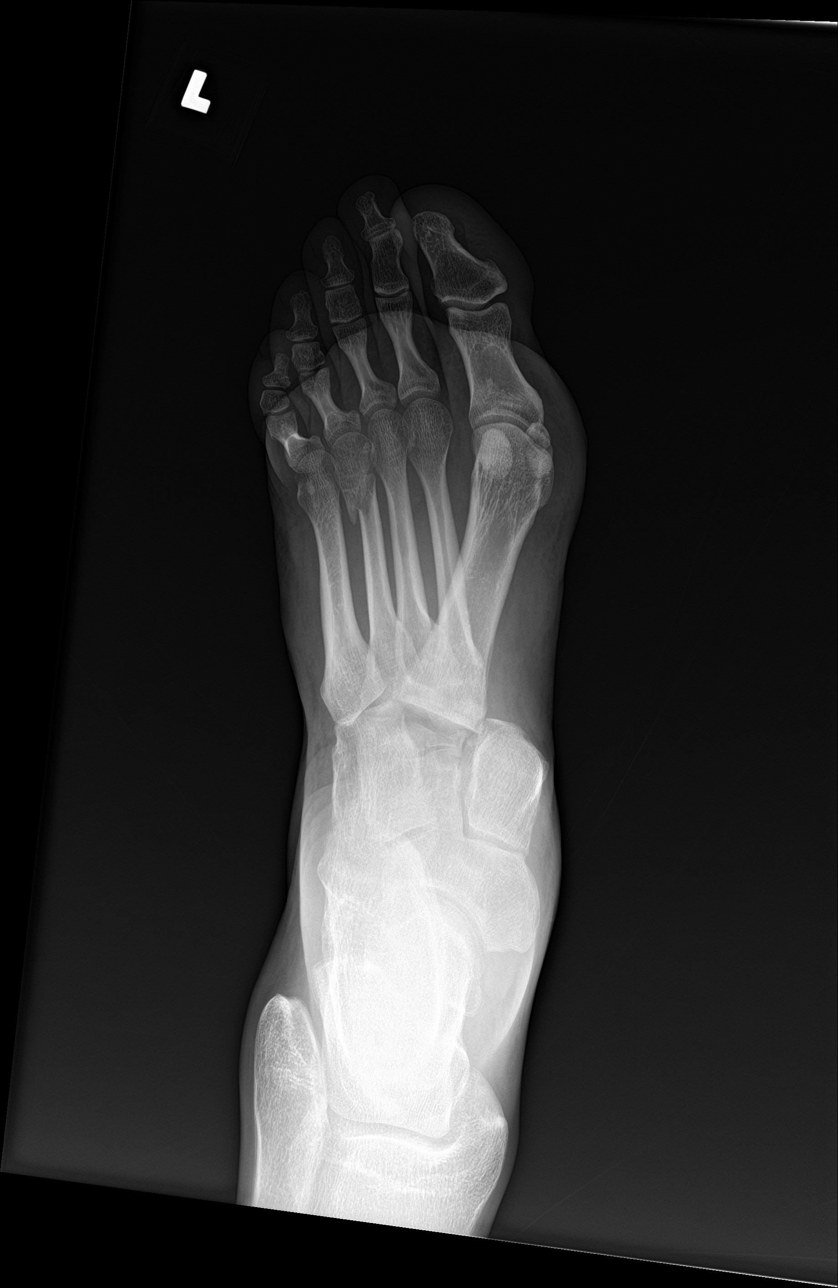

[foot lat]
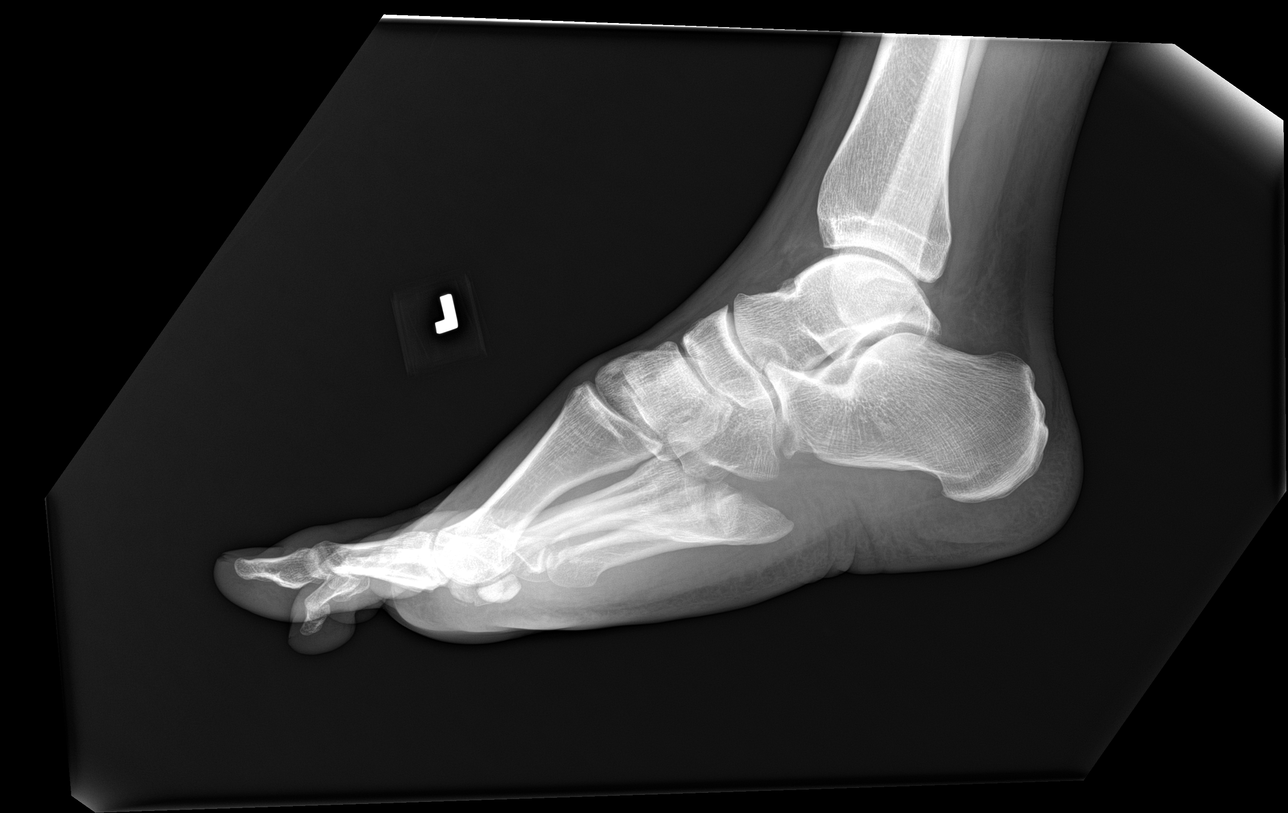

[foot ap (1 of 2)]
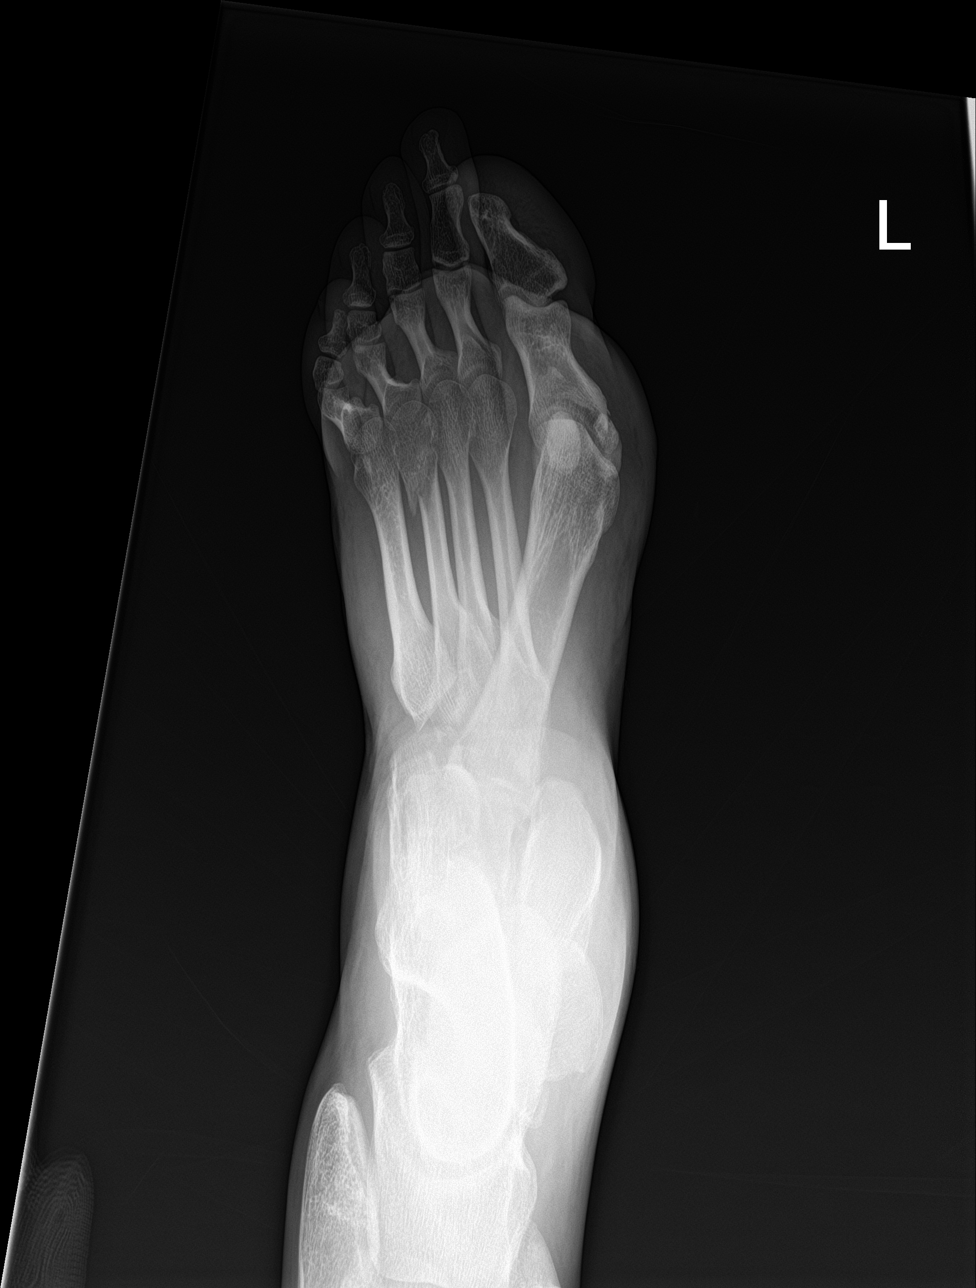

[foot ap (2 of 2)]
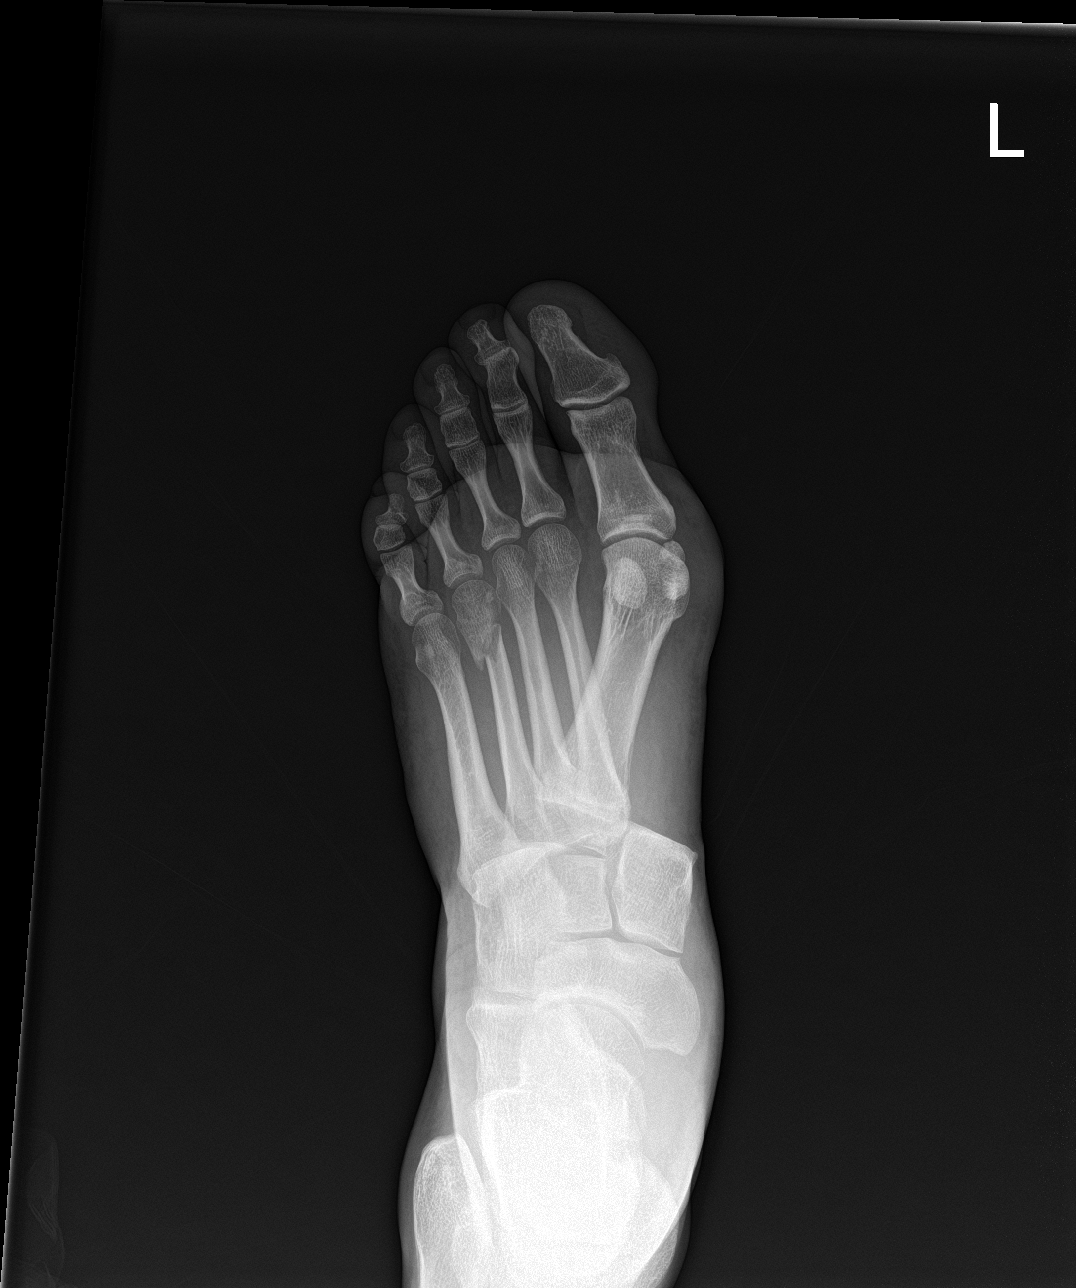

[4 of 4 positions shown; findings below may reference images not displayed]

FINDINGS: Mildly displaced fracture is seen involving the distal fourth
metatarsal. Lisfranc dislocations are seen involving the
tarsometatarsal joints.
IMPRESSION: Lisfranc dislocations are seen involving all 5 tarsometatarsal
joints. Mildly displaced distal fourth metatarsal fracture is noted.
